# Patient Record
Sex: Female | Born: 1935 | Race: White | Hispanic: No | Marital: Married | Smoking: Never smoker
Health system: Southern US, Community
[De-identification: ages and names within clinical notes are randomized; demographics above are authoritative.]

## PROBLEM LIST (undated history)

## (undated) DIAGNOSIS — E559 Vitamin D deficiency, unspecified: Secondary | ICD-10-CM

## (undated) DIAGNOSIS — N289 Disorder of kidney and ureter, unspecified: Secondary | ICD-10-CM

## (undated) DIAGNOSIS — E785 Hyperlipidemia, unspecified: Secondary | ICD-10-CM

## (undated) DIAGNOSIS — I10 Essential (primary) hypertension: Secondary | ICD-10-CM

## (undated) DIAGNOSIS — K219 Gastro-esophageal reflux disease without esophagitis: Secondary | ICD-10-CM

## (undated) DIAGNOSIS — M810 Age-related osteoporosis without current pathological fracture: Secondary | ICD-10-CM

## (undated) DIAGNOSIS — H269 Unspecified cataract: Secondary | ICD-10-CM

## (undated) HISTORY — PX: APPENDECTOMY: SHX54

---

## 1999-07-14 ENCOUNTER — Encounter: Payer: Self-pay | Admitting: Family Medicine

## 1999-07-14 ENCOUNTER — Ambulatory Visit (HOSPITAL_COMMUNITY): Admission: RE | Admit: 1999-07-14 | Discharge: 1999-07-14 | Payer: Self-pay | Admitting: Family Medicine

## 2006-05-17 ENCOUNTER — Ambulatory Visit: Payer: Self-pay | Admitting: Emergency Medicine

## 2006-06-28 ENCOUNTER — Encounter: Admission: RE | Admit: 2006-06-28 | Discharge: 2006-07-26 | Payer: Self-pay | Admitting: Surgery

## 2008-10-22 ENCOUNTER — Encounter: Admission: RE | Admit: 2008-10-22 | Discharge: 2008-10-22 | Payer: Self-pay | Admitting: Family Medicine

## 2009-10-26 ENCOUNTER — Encounter: Admission: RE | Admit: 2009-10-26 | Discharge: 2009-10-26 | Payer: Self-pay | Admitting: Family Medicine

## 2010-12-12 ENCOUNTER — Encounter: Payer: Self-pay | Admitting: Family Medicine

## 2010-12-22 ENCOUNTER — Encounter: Payer: Self-pay | Admitting: Family Medicine

## 2011-09-19 ENCOUNTER — Other Ambulatory Visit: Payer: Self-pay | Admitting: Family Medicine

## 2011-09-20 ENCOUNTER — Other Ambulatory Visit: Payer: Self-pay | Admitting: Family Medicine

## 2011-09-20 DIAGNOSIS — Z1231 Encounter for screening mammogram for malignant neoplasm of breast: Secondary | ICD-10-CM

## 2011-09-20 DIAGNOSIS — M8580 Other specified disorders of bone density and structure, unspecified site: Secondary | ICD-10-CM

## 2011-10-31 ENCOUNTER — Ambulatory Visit
Admission: RE | Admit: 2011-10-31 | Discharge: 2011-10-31 | Disposition: A | Discharge status: Home / Self Care | Payer: Medicare Other | Source: Ambulatory Visit | Attending: Family Medicine | Admitting: Family Medicine

## 2011-10-31 DIAGNOSIS — Z1231 Encounter for screening mammogram for malignant neoplasm of breast: Secondary | ICD-10-CM

## 2011-10-31 DIAGNOSIS — M8580 Other specified disorders of bone density and structure, unspecified site: Secondary | ICD-10-CM

## 2012-10-15 ENCOUNTER — Other Ambulatory Visit: Payer: Self-pay | Admitting: Family Medicine

## 2012-10-15 DIAGNOSIS — Z1231 Encounter for screening mammogram for malignant neoplasm of breast: Secondary | ICD-10-CM

## 2012-10-15 DIAGNOSIS — M8580 Other specified disorders of bone density and structure, unspecified site: Secondary | ICD-10-CM

## 2013-10-30 ENCOUNTER — Other Ambulatory Visit: Payer: Self-pay | Admitting: Family Medicine

## 2013-10-30 ENCOUNTER — Other Ambulatory Visit: Payer: Self-pay

## 2013-10-30 DIAGNOSIS — Z1231 Encounter for screening mammogram for malignant neoplasm of breast: Secondary | ICD-10-CM

## 2013-10-30 DIAGNOSIS — M8580 Other specified disorders of bone density and structure, unspecified site: Secondary | ICD-10-CM

## 2013-10-31 ENCOUNTER — Ambulatory Visit
Admission: RE | Admit: 2013-10-31 | Discharge: 2013-10-31 | Disposition: A | Discharge status: Home / Self Care | Payer: Medicare Other | Source: Ambulatory Visit

## 2013-10-31 ENCOUNTER — Other Ambulatory Visit: Payer: Medicare Other

## 2013-10-31 ENCOUNTER — Ambulatory Visit: Payer: Medicare Other

## 2013-10-31 ENCOUNTER — Ambulatory Visit
Admission: RE | Admit: 2013-10-31 | Discharge: 2013-10-31 | Disposition: A | Discharge status: Home / Self Care | Payer: Medicare Other | Source: Ambulatory Visit | Attending: Family Medicine | Admitting: Family Medicine

## 2013-10-31 DIAGNOSIS — M8580 Other specified disorders of bone density and structure, unspecified site: Secondary | ICD-10-CM

## 2013-10-31 DIAGNOSIS — Z1231 Encounter for screening mammogram for malignant neoplasm of breast: Secondary | ICD-10-CM

## 2014-09-19 ENCOUNTER — Encounter (HOSPITAL_COMMUNITY): Payer: Self-pay | Admitting: Emergency Medicine

## 2014-09-19 ENCOUNTER — Emergency Department (HOSPITAL_COMMUNITY)
Admission: EM | Admit: 2014-09-19 | Discharge: 2014-09-19 | Disposition: A | Discharge status: Home / Self Care | Payer: Medicare Other | Attending: Emergency Medicine | Admitting: Emergency Medicine

## 2014-09-19 ENCOUNTER — Emergency Department (HOSPITAL_COMMUNITY): Payer: Medicare Other

## 2014-09-19 DIAGNOSIS — Z79899 Other long term (current) drug therapy: Secondary | ICD-10-CM | POA: Insufficient documentation

## 2014-09-19 DIAGNOSIS — Z880 Allergy status to penicillin: Secondary | ICD-10-CM | POA: Insufficient documentation

## 2014-09-19 DIAGNOSIS — Z8742 Personal history of other diseases of the female genital tract: Secondary | ICD-10-CM | POA: Insufficient documentation

## 2014-09-19 DIAGNOSIS — R1031 Right lower quadrant pain: Secondary | ICD-10-CM | POA: Diagnosis not present

## 2014-09-19 DIAGNOSIS — K529 Noninfective gastroenteritis and colitis, unspecified: Secondary | ICD-10-CM | POA: Diagnosis not present

## 2014-09-19 DIAGNOSIS — Z7982 Long term (current) use of aspirin: Secondary | ICD-10-CM | POA: Diagnosis not present

## 2014-09-19 DIAGNOSIS — Z792 Long term (current) use of antibiotics: Secondary | ICD-10-CM | POA: Diagnosis not present

## 2014-09-19 DIAGNOSIS — I10 Essential (primary) hypertension: Secondary | ICD-10-CM | POA: Diagnosis not present

## 2014-09-19 DIAGNOSIS — K219 Gastro-esophageal reflux disease without esophagitis: Secondary | ICD-10-CM | POA: Insufficient documentation

## 2014-09-19 DIAGNOSIS — E785 Hyperlipidemia, unspecified: Secondary | ICD-10-CM | POA: Insufficient documentation

## 2014-09-19 DIAGNOSIS — Z9089 Acquired absence of other organs: Secondary | ICD-10-CM | POA: Diagnosis not present

## 2014-09-19 HISTORY — DX: Disorder of kidney and ureter, unspecified: N28.9

## 2014-09-19 HISTORY — DX: Age-related osteoporosis without current pathological fracture: M81.0

## 2014-09-19 HISTORY — DX: Unspecified cataract: H26.9

## 2014-09-19 HISTORY — DX: Hyperlipidemia, unspecified: E78.5

## 2014-09-19 HISTORY — DX: Essential (primary) hypertension: I10

## 2014-09-19 HISTORY — DX: Gastro-esophageal reflux disease without esophagitis: K21.9

## 2014-09-19 HISTORY — DX: Vitamin D deficiency, unspecified: E55.9

## 2014-09-19 LAB — COMPREHENSIVE METABOLIC PANEL
ALT: 28 U/L (ref 0–35)
AST: 22 U/L (ref 0–37)
Albumin: 3.5 g/dL (ref 3.5–5.2)
Alkaline Phosphatase: 52 U/L (ref 39–117)
Anion gap: 14 (ref 5–15)
BUN: 37 mg/dL — ABNORMAL HIGH (ref 6–23)
CO2: 24 mEq/L (ref 19–32)
Calcium: 9.2 mg/dL (ref 8.4–10.5)
Chloride: 96 mEq/L (ref 96–112)
Creatinine, Ser: 1.67 mg/dL — ABNORMAL HIGH (ref 0.50–1.10)
GFR calc Af Amer: 33 mL/min — ABNORMAL LOW (ref >90)
GFR calc non Af Amer: 28 mL/min — ABNORMAL LOW (ref >90)
Glucose, Bld: 109 mg/dL — ABNORMAL HIGH (ref 70–99)
Potassium: 4.5 mEq/L (ref 3.7–5.3)
Sodium: 134 mEq/L — ABNORMAL LOW (ref 137–147)
Total Bilirubin: 0.4 mg/dL (ref 0.3–1.2)
Total Protein: 6.8 g/dL (ref 6.0–8.3)

## 2014-09-19 LAB — URINE MICROSCOPIC-ADD ON

## 2014-09-19 LAB — CBC WITH DIFFERENTIAL/PLATELET
Basophils Absolute: 0 10*3/uL (ref 0.0–0.1)
Basophils Relative: 0 % (ref 0–1)
Eosinophils Absolute: 0.1 10*3/uL (ref 0.0–0.7)
Eosinophils Relative: 1 % (ref 0–5)
HCT: 38.1 % (ref 36.0–46.0)
Hemoglobin: 12.9 g/dL (ref 12.0–15.0)
Lymphocytes Relative: 11 % — ABNORMAL LOW (ref 12–46)
Lymphs Abs: 3.2 10*3/uL (ref 0.7–4.0)
MCH: 32.2 pg (ref 26.0–34.0)
MCHC: 33.9 g/dL (ref 30.0–36.0)
MCV: 95 fL (ref 78.0–100.0)
Monocytes Absolute: 2.9 10*3/uL — ABNORMAL HIGH (ref 0.1–1.0)
Monocytes Relative: 10 % (ref 3–12)
Neutro Abs: 22 10*3/uL — ABNORMAL HIGH (ref 1.7–7.7)
Neutrophils Relative %: 78 % — ABNORMAL HIGH (ref 43–77)
Platelets: 209 10*3/uL (ref 150–400)
RBC: 4.01 MIL/uL (ref 3.87–5.11)
RDW: 12.4 % (ref 11.5–15.5)
WBC: 28.3 10*3/uL — ABNORMAL HIGH (ref 4.0–10.5)

## 2014-09-19 LAB — URINALYSIS, ROUTINE W REFLEX MICROSCOPIC
Bilirubin Urine: NEGATIVE
Glucose, UA: NEGATIVE mg/dL
Hgb urine dipstick: NEGATIVE
Ketones, ur: NEGATIVE mg/dL
Nitrite: NEGATIVE
Protein, ur: NEGATIVE mg/dL
Specific Gravity, Urine: 1.018 (ref 1.005–1.030)
Urobilinogen, UA: 0.2 mg/dL (ref 0.0–1.0)
pH: 5 (ref 5.0–8.0)

## 2014-09-19 LAB — LIPASE, BLOOD: Lipase: 31 U/L (ref 11–59)

## 2014-09-19 MED ORDER — HYDROCODONE-ACETAMINOPHEN 5-325 MG PO TABS
2.0000 | ORAL_TABLET | Freq: Once | ORAL | Status: AC
  Administered 2014-09-19: 2 via ORAL
  Filled 2014-09-19: qty 2

## 2014-09-19 MED ORDER — CIPROFLOXACIN HCL 500 MG PO TABS: 500.0000 mg | ORAL_TABLET | Freq: Two times a day (BID) | ORAL | Status: DC

## 2014-09-19 MED ORDER — HYDROCODONE-ACETAMINOPHEN 5-325 MG PO TABS: 1.0000 | ORAL_TABLET | Freq: Four times a day (QID) | ORAL | Status: DC | PRN

## 2014-09-19 MED ORDER — ONDANSETRON HCL 4 MG PO TABS: 4.0000 mg | ORAL_TABLET | Freq: Four times a day (QID) | ORAL | Status: DC

## 2014-09-19 MED ORDER — METRONIDAZOLE 500 MG PO TABS: 500.0000 mg | ORAL_TABLET | Freq: Two times a day (BID) | ORAL | Status: DC

## 2014-09-19 MED ORDER — METRONIDAZOLE 500 MG PO TABS
500.0000 mg | ORAL_TABLET | Freq: Once | ORAL | Status: AC
  Administered 2014-09-19: 500 mg via ORAL
  Filled 2014-09-19: qty 1

## 2014-09-19 MED ORDER — CIPROFLOXACIN HCL 500 MG PO TABS
500.0000 mg | ORAL_TABLET | Freq: Once | ORAL | Status: AC
  Administered 2014-09-19: 500 mg via ORAL
  Filled 2014-09-19: qty 1

## 2014-09-19 NOTE — Discharge Instructions (Signed)

## 2014-09-19 NOTE — ED Provider Notes (Addendum)
CSN: [id number]     Arrival date & time 09/19/14  1731 History   First MD Initiated Contact with Patient 09/19/14 1859     Chief Complaint  Patient presents with  . Abdominal Pain     (Consider location/radiation/quality/duration/timing/severity/associated sxs/prior Treatment) Patient is a 80 y.o. female presenting with abdominal pain. The history is provided by the patient and the spouse. No language interpreter was used.  Abdominal Pain Pain location:  RLQ Pain quality: sharp   Pain radiates to:  Does not radiate Pain severity:  Severe Onset quality:  Gradual Duration:  1 day Timing:  Constant Progression:  Unchanged Chronicity:  New Relieved by:  Nothing Worsened by:  Nothing tried Ineffective treatments:  None tried Associated symptoms: no anorexia, no chest pain, no chills, no constipation, no cough, no diarrhea, no dysuria, no fatigue, no fever, no hematuria, no melena, no nausea, no shortness of breath, no sore throat and no vomiting   Risk factors: being elderly   Risk factors comment:  History of appendectomy   Past Medical History  Diagnosis Date  . Hypertension   . Renal disorder   . GERD (gastroesophageal reflux disease)   . Hyperlipemia   . Cataract   . Osteoporosis   . Vitamin D deficiency disease    Past Surgical History  Procedure Laterality Date  . Appendectomy     No family history on file. History  Substance Use Topics  . Smoking status: Never Smoker   . Smokeless tobacco: Not on file  . Alcohol Use: No   OB History   Grav Para Term Preterm Abortions TAB SAB Ect Mult Living                 Review of Systems  Constitutional: Negative for fever, chills, diaphoresis, activity change, appetite change and fatigue.  HENT: Negative for congestion, facial swelling, rhinorrhea and sore throat.   Eyes: Negative for photophobia and discharge.  Respiratory: Negative for cough, chest tightness and shortness of breath.   Cardiovascular: Negative for  chest pain, palpitations and leg swelling.  Gastrointestinal: Positive for abdominal pain. Negative for nausea, vomiting, diarrhea, constipation, melena and anorexia.  Endocrine: Negative for polydipsia and polyuria.  Genitourinary: Negative for dysuria, frequency, hematuria, difficulty urinating and pelvic pain.  Musculoskeletal: Negative for arthralgias, back pain, neck pain and neck stiffness.  Skin: Negative for color change and wound.  Allergic/Immunologic: Negative for immunocompromised state.  Neurological: Negative for facial asymmetry, weakness, numbness and headaches.  Hematological: Does not bruise/bleed easily.  Psychiatric/Behavioral: Negative for confusion and agitation.      Allergies  Cortisone and Penicillins  Home Medications   Prior to Admission medications   Medication Sig Start Date End Date Taking? Authorizing Provider  ALPRAZolam (XANAX) 0.25 MG tablet Take 0.25 mg by mouth 2 (two) times daily as needed for anxiety (anxiety).   Yes Historical Provider, MD  aspirin 81 MG tablet Take 81 mg by mouth daily.   Yes Historical Provider, MD  bisoprolol-hydrochlorothiazide (ZIAC) 10-6.25 MG per tablet Take 1 tablet by mouth 2 (two) times daily.   Yes Historical Provider, MD  losartan (COZAAR) 100 MG tablet Take 100 mg by mouth daily.   Yes Historical Provider, MD  pantoprazole (PROTONIX) 40 MG tablet Take 40 mg by mouth daily.   Yes Historical Provider, MD  pseudoephedrine-acetaminophen (TYLENOL SINUS) 30-500 MG TABS Take 1 tablet by mouth every 4 (four) hours as needed (headache).   Yes Historical Provider, MD  simvastatin (ZOCOR) 20 MG tablet  Take 20 mg by mouth daily.   Yes Historical Provider, MD  ciprofloxacin (CIPRO) 500 MG tablet Take 1 tablet (500 mg total) by mouth 2 (two) times daily. One po bid x 7 days 09/19/14   Toy CookeyMegan Eyad Rochford, MD  HYDROcodone-acetaminophen (NORCO) 5-325 MG per tablet Take 1-2 tablets by mouth every 6 (six) hours as needed for moderate pain.  09/19/14   Toy CookeyMegan Unnamed Hino, MD  metroNIDAZOLE (FLAGYL) 500 MG tablet Take 1 tablet (500 mg total) by mouth 2 (two) times daily. One po bid x 7 days 09/19/14   Toy CookeyMegan Janie Capp, MD  ondansetron (ZOFRAN) 4 MG tablet Take 1 tablet (4 mg total) by mouth every 6 (six) hours. 09/19/14   Toy CookeyMegan Linda Biehn, MD   BP 128/69  Pulse 66  Temp(Src) 97.6 F (36.4 C) (Oral)  Resp 16  SpO2 99% Physical Exam  Constitutional: She is oriented to person, place, and time. She appears well-developed and well-nourished. No distress.  HENT:  Head: Normocephalic and atraumatic.  Mouth/Throat: No oropharyngeal exudate.  Eyes: Pupils are equal, round, and reactive to light.  Neck: Normal range of motion. Neck supple.  Cardiovascular: Normal rate, regular rhythm and normal heart sounds.  Exam reveals no gallop and no friction rub.   No murmur heard. Pulmonary/Chest: Effort normal and breath sounds normal. No respiratory distress. She has no wheezes. She has no rales.  Abdominal: Soft. Bowel sounds are normal. She exhibits no distension and no mass. There is no tenderness. There is no rigidity, no rebound and no guarding.  Musculoskeletal: Normal range of motion. She exhibits no edema or tenderness.  Neurological: She is alert and oriented to person, place, and time.  Skin: Skin is warm and dry.  Psychiatric: She has a normal mood and affect.    ED Course  Procedures (including critical care time) Labs Review Labs Reviewed  CBC WITH DIFFERENTIAL - Abnormal; Notable for the following:    WBC 28.3 (*)    Neutrophils Relative % 78 (*)    Neutro Abs 22.0 (*)    Lymphocytes Relative 11 (*)    Monocytes Absolute 2.9 (*)    All other components within normal limits  COMPREHENSIVE METABOLIC PANEL - Abnormal; Notable for the following:    Sodium 134 (*)    Glucose, Bld 109 (*)    BUN 37 (*)    Creatinine, Ser 1.67 (*)    GFR calc non Af Amer 28 (*)    GFR calc Af Amer 33 (*)    All other components within normal  limits  URINALYSIS, ROUTINE W REFLEX MICROSCOPIC - Abnormal; Notable for the following:    APPearance CLOUDY (*)    Leukocytes, UA MODERATE (*)    All other components within normal limits  URINE MICROSCOPIC-ADD ON - Abnormal; Notable for the following:    Squamous Epithelial / LPF FEW (*)    All other components within normal limits  URINE CULTURE  LIPASE, BLOOD    Imaging Review Ct Abdomen Pelvis Wo Contrast  09/19/2014   CLINICAL DATA:  Right lower quadrant pain for 2 days  EXAM: CT ABDOMEN AND PELVIS WITHOUT CONTRAST  TECHNIQUE: Multidetector CT imaging of the abdomen and pelvis was performed following the standard protocol without IV contrast.  COMPARISON:  None.  FINDINGS: The lung bases are free of acute infiltrate or sizable effusion.  The liver, gallbladder, spleen, adrenal glands and pancreas are all normal in their CT appearance. The kidneys are well visualized bilaterally and reveal no renal calculi or  obstructive changes. Aortic calcifications are seen without aneurysmal dilatation. The appendix is not visualized consistent with the patient's given clinical history of prior appendectomy. The bladder is well distended. The uterus has been surgically removed. Diverticular change of the sigmoid colon is noted without evidence of diverticulitis. No perforation is seen. Some very mild inflammatory changes noted surrounding the cecum which is filled with fecal material. This is of uncertain significance but may represent some very early colitis. Degenerative changes of the lumbar spine are seen.  IMPRESSION: Mild right lower quadrant inflammatory changes surrounding the cecum. This may represent some very early colitis.  Diverticulosis without diverticulitis.  No other focal abnormality is seen.   Electronically Signed   By: Alcide CleverMark  Lukens M.D.   On: 09/19/2014 21:08     EKG Interpretation None      MDM   Final diagnoses:  RLQ abdominal pain  Colitis    Pt is a 80 y.o. female with  Pmhx as above who presents with constant sharp right lower quadrant pain since yesterday. Pain is unchanged by eating or drinking. She's had no associated fever, chills, anorexia, urinary symptoms, diarrhea or constipation. On physical exam vital signs are stable and patient is in no acute distress. She has R surgical scar in the right lower quadrant from prior appendectomy. She is tenderness over palpation of this area with palpable mass that is likely hernia. She's no overlying skin changes.  Labs with leukocytosis, the patient has recently received cortisone injection. Creatinine is mildly elevated at 1.67 without prior for comparison. LFTs normal. Lipase normal. CT abdomen pelvis with mild right lower quadrant inflammatory changes around the cecum may represent very early colitis which I believe is consistent with history and physical exam. We'll give first dose of by mouth Cipro Flagyl and by mouth Norco in the ED. We'll plan to discharge home with same assuming she tolerates these. I will also have her follow-up closely with her PCP. Return precautions given for new or worsening symptoms including worsening pain, fever, inability to tolerate liquids.    Toy CookeyMegan Cheyenna Pankowski, MD 09/20/14 1200  Toy CookeyMegan Nicolus Ose, MD 10/06/14 [id number]

## 2014-09-19 NOTE — ED Notes (Signed)
Pt ambulated with assistance to rest room, states she was to nervous to provide urine specimen.

## 2014-09-19 NOTE — ED Notes (Signed)
Pt still states that she is unable to urinate, going to CT now.

## 2014-09-19 NOTE — ED Notes (Signed)
Pt c/o RLQ pain that started yesterday.  [facility] and was sent here for further evaluation bc doctor there is concerned that pt could have bowel obstruction, diverticulitis since pt had appendectomy when she was 80 years old. Pt denies n/v/d.

## 2014-09-22 LAB — URINE CULTURE: Colony Count: >=100000

## 2014-09-23 ENCOUNTER — Telehealth (HOSPITAL_BASED_OUTPATIENT_CLINIC_OR_DEPARTMENT_OTHER): Payer: Self-pay | Admitting: Emergency Medicine

## 2014-09-23 NOTE — Telephone Encounter (Signed)
Post ED Visit - Positive Culture Follow-up  Culture report reviewed by antimicrobial stewardship pharmacist: [x]  Wes Dulaney, Pharm.D., BCPS []  Celedonio MiyamotoJeremy Frens, Pharm.D., BCPS []  Georgina PillionElizabeth Martin, Pharm.D., BCPS []  VeguitaMinh Pham, [address]D., BCPS, AAHIVP []  Estella HuskMichelle Turner, Pharm.D., BCPS, AAHIVP []  Carly Sabat, Pharm.D. []  Enzo BiNathan Batchelder, [address]  Positive urine culture >100,000 colonies/ml E. Coli Treated with ciprofloxacin and metronidazole , organism sensitive to the same and no further patient follow-up is required at this time.  Berle MullMiller, Hagop Mccollam 09/23/2014, 12:11 PM

## 2015-11-09 ENCOUNTER — Other Ambulatory Visit: Payer: Self-pay | Admitting: Family Medicine

## 2015-11-09 DIAGNOSIS — M8580 Other specified disorders of bone density and structure, unspecified site: Secondary | ICD-10-CM

## 2015-12-11 ENCOUNTER — Inpatient Hospital Stay: Admission: RE | Admit: 2015-12-11 | Payer: Self-pay | Source: Ambulatory Visit

## 2016-02-02 ENCOUNTER — Ambulatory Visit
Admission: RE | Admit: 2016-02-02 | Discharge: 2016-02-02 | Disposition: A | Discharge status: Home / Self Care | Payer: Medicare Other | Source: Ambulatory Visit | Attending: Family Medicine | Admitting: Family Medicine

## 2016-02-02 DIAGNOSIS — M8580 Other specified disorders of bone density and structure, unspecified site: Secondary | ICD-10-CM

## 2016-07-20 ENCOUNTER — Other Ambulatory Visit: Payer: Self-pay | Admitting: Physician Assistant

## 2016-07-20 DIAGNOSIS — R1310 Dysphagia, unspecified: Secondary | ICD-10-CM

## 2016-07-20 DIAGNOSIS — R1319 Other dysphagia: Secondary | ICD-10-CM

## 2016-07-27 ENCOUNTER — Ambulatory Visit
Admission: RE | Admit: 2016-07-27 | Discharge: 2016-07-27 | Disposition: A | Discharge status: Home / Self Care | Payer: Medicare Other | Source: Ambulatory Visit | Attending: Physician Assistant | Admitting: Physician Assistant

## 2016-07-27 DIAGNOSIS — R1310 Dysphagia, unspecified: Secondary | ICD-10-CM

## 2016-07-27 DIAGNOSIS — R1319 Other dysphagia: Secondary | ICD-10-CM

## 2017-02-27 ENCOUNTER — Other Ambulatory Visit: Payer: Self-pay | Admitting: Family Medicine

## 2017-02-27 DIAGNOSIS — Z1231 Encounter for screening mammogram for malignant neoplasm of breast: Secondary | ICD-10-CM

## 2017-03-23 ENCOUNTER — Ambulatory Visit
Admission: RE | Admit: 2017-03-23 | Discharge: 2017-03-23 | Disposition: A | Discharge status: Home / Self Care | Payer: Medicare Other | Source: Ambulatory Visit | Attending: Family Medicine | Admitting: Family Medicine

## 2017-03-23 DIAGNOSIS — Z1231 Encounter for screening mammogram for malignant neoplasm of breast: Secondary | ICD-10-CM

## 2018-04-30 ENCOUNTER — Other Ambulatory Visit: Payer: Self-pay | Admitting: Family Medicine

## 2018-04-30 DIAGNOSIS — Z1231 Encounter for screening mammogram for malignant neoplasm of breast: Secondary | ICD-10-CM

## 2018-06-05 ENCOUNTER — Ambulatory Visit
Admission: RE | Admit: 2018-06-05 | Discharge: 2018-06-05 | Disposition: A | Discharge status: Home / Self Care | Payer: Medicare Other | Source: Ambulatory Visit | Attending: Family Medicine | Admitting: Family Medicine

## 2018-06-05 DIAGNOSIS — Z1231 Encounter for screening mammogram for malignant neoplasm of breast: Secondary | ICD-10-CM

## 2018-06-06 ENCOUNTER — Ambulatory Visit: Payer: Medicare Other

## 2019-04-26 ENCOUNTER — Other Ambulatory Visit: Payer: Self-pay | Admitting: Family Medicine

## 2019-04-26 DIAGNOSIS — Z1231 Encounter for screening mammogram for malignant neoplasm of breast: Secondary | ICD-10-CM

## 2019-06-12 ENCOUNTER — Ambulatory Visit: Payer: Medicare Other

## 2019-07-15 ENCOUNTER — Ambulatory Visit: Payer: Medicare Other

## 2019-12-19 ENCOUNTER — Ambulatory Visit: Payer: Medicare Other

## 2019-12-27 ENCOUNTER — Ambulatory Visit: Payer: Medicare Other | Attending: Internal Medicine

## 2019-12-27 DIAGNOSIS — Z23 Encounter for immunization: Secondary | ICD-10-CM | POA: Insufficient documentation

## 2019-12-27 NOTE — Progress Notes (Signed)
   Covid-19 Vaccination Clinic  Name:  YULI LANIGAN    MRN: [id number] DOB: 1932-12-08  12/27/2019  Ms. Seeber was observed post Covid-19 immunization for 15 minutes without incidence. She was provided with Vaccine Information Sheet and instruction to access the V-Safe system.   Ms. Buchan was instructed to call 911 with any severe reactions post vaccine: Marland Kitchen Difficulty breathing  . Swelling of your face and throat  . A fast heartbeat  . A bad rash all over your body  . Dizziness and weakness    Immunizations Administered    Name Date Dose VIS Date Route   Pfizer COVID-19 Vaccine 12/27/2019 11:11 AM 0.3 mL 11/01/2019 Intramuscular   Manufacturer: [employer], [employer]   Lot: [id number]   NDC: [address]-2

## 2019-12-30 ENCOUNTER — Ambulatory Visit: Payer: Medicare Other

## 2020-01-21 ENCOUNTER — Ambulatory Visit: Payer: Medicare Other | Attending: Internal Medicine

## 2020-01-21 DIAGNOSIS — Z23 Encounter for immunization: Secondary | ICD-10-CM | POA: Insufficient documentation

## 2020-01-21 NOTE — Progress Notes (Signed)
   Covid-19 Vaccination Clinic  Name:  Elizabeth Brock    MRN: [id number] DOB: [birth year]  01/21/2020  Elizabeth Brock was observed post Covid-19 immunization for 15 minutes without incident. She was provided with Vaccine Information Sheet and instruction to access the V-Safe system.   Elizabeth Brock was instructed to call 911 with any severe reactions post vaccine: Marland Kitchen Difficulty breathing  . Swelling of face and throat  . A fast heartbeat  . A bad rash all over body  . Dizziness and weakness   Immunizations Administered    Name Date Dose VIS Date Route   Pfizer COVID-19 Vaccine 01/21/2020 11:35 AM 0.3 mL 11/01/2019 Intramuscular   Manufacturer: [employer], [employer]   Lot: [id number]   NDC: [address]-7

## 2020-04-10 ENCOUNTER — Other Ambulatory Visit: Payer: Self-pay | Admitting: Gastroenterology

## 2020-04-10 DIAGNOSIS — R1310 Dysphagia, unspecified: Secondary | ICD-10-CM

## 2020-04-14 ENCOUNTER — Other Ambulatory Visit: Payer: Self-pay | Admitting: Gastroenterology

## 2020-04-14 ENCOUNTER — Ambulatory Visit
Admission: RE | Admit: 2020-04-14 | Discharge: 2020-04-14 | Disposition: A | Discharge status: Home / Self Care | Payer: Medicare Other | Source: Ambulatory Visit | Attending: Gastroenterology | Admitting: Gastroenterology

## 2020-04-14 DIAGNOSIS — R1310 Dysphagia, unspecified: Secondary | ICD-10-CM

## 2020-07-30 ENCOUNTER — Other Ambulatory Visit: Payer: Self-pay | Admitting: Podiatry

## 2020-07-30 ENCOUNTER — Ambulatory Visit (INDEPENDENT_AMBULATORY_CARE_PROVIDER_SITE_OTHER): Payer: Medicare Other | Admitting: Podiatry

## 2020-07-30 ENCOUNTER — Encounter: Payer: Self-pay | Admitting: Podiatry

## 2020-07-30 ENCOUNTER — Other Ambulatory Visit: Payer: Self-pay

## 2020-07-30 ENCOUNTER — Ambulatory Visit (INDEPENDENT_AMBULATORY_CARE_PROVIDER_SITE_OTHER): Payer: Medicare Other

## 2020-07-30 DIAGNOSIS — M778 Other enthesopathies, not elsewhere classified: Secondary | ICD-10-CM

## 2020-07-30 DIAGNOSIS — B351 Tinea unguium: Secondary | ICD-10-CM | POA: Diagnosis not present

## 2020-07-30 DIAGNOSIS — M722 Plantar fascial fibromatosis: Secondary | ICD-10-CM

## 2020-07-30 DIAGNOSIS — M79676 Pain in unspecified toe(s): Secondary | ICD-10-CM | POA: Diagnosis not present

## 2020-07-30 DIAGNOSIS — M19079 Primary osteoarthritis, unspecified ankle and foot: Secondary | ICD-10-CM

## 2020-07-30 NOTE — Progress Notes (Signed)
  Subjective:  Patient ID: Elizabeth Brock, female    DOB: 07-13-1933,  MRN: [id number] HPI Chief Complaint  Patient presents with  . Foot Pain    Dorsal midfoot left - aching x years intermittent, swelling, tried aspirin  . New Patient (Initial Visit)    86 y.o. female presents with the above complaint.   ROS: Denies fever chills nausea vomiting muscle aches pains calf pain back pain chest pain shortness of breath.  Past Medical History:  Diagnosis Date  . Cataract   . GERD (gastroesophageal reflux disease)   . Hyperlipemia   . Hypertension   . Osteoporosis   . Renal disorder   . Vitamin D deficiency disease    Past Surgical History:  Procedure Laterality Date  . APPENDECTOMY      Current Outpatient Medications:  .  alendronate (FOSAMAX) 70 MG tablet, Take 70 mg by mouth once a week., Disp: , Rfl:  .  ALPRAZolam (XANAX) 0.25 MG tablet, Take 0.25 mg by mouth 2 (two) times daily as needed for anxiety (anxiety)., Disp: , Rfl:  .  aspirin 81 MG tablet, Take 81 mg by mouth daily., Disp: , Rfl:  .  bisoprolol-hydrochlorothiazide (ZIAC) 10-6.25 MG per tablet, Take 1 tablet by mouth 2 (two) times daily., Disp: , Rfl:  .  famotidine (PEPCID) 20 MG tablet, Take 20 mg by mouth 2 (two) times daily., Disp: , Rfl:  .  losartan (COZAAR) 100 MG tablet, Take 100 mg by mouth daily., Disp: , Rfl:  .  sertraline (ZOLOFT) 50 MG tablet, Take 75 mg by mouth daily., Disp: , Rfl:  .  simvastatin (ZOCOR) 20 MG tablet, Take 20 mg by mouth daily., Disp: , Rfl:   Allergies  Allergen Reactions  . Cortisone Hives    Hives around neck.  . Penicillins Hives    Hives around ears.   Review of Systems Objective:  There were no vitals filed for this visit.  General: Well developed, nourished, in no acute distress, alert and oriented x3   Dermatological: Skin is warm, dry and supple bilateral. Nails x 10 are thick yellow dystrophic possibly mycotic; remaining integument appears unremarkable at this  time. There are no open sores, no preulcerative lesions, no rash or signs of infection present.  Nails are thicker and dystrophic possibly mycotic.  Vascular: Dorsalis Pedis artery and Posterior Tibial artery pedal pulses are 2/4 bilateral with immedate capillary fill time. Pedal hair growth present. No varicosities and no lower extremity edema present bilateral.   Neruologic: Grossly intact via light touch bilateral. Vibratory intact via tuning fork bilateral. Protective threshold with Semmes Wienstein monofilament intact to all pedal sites bilateral. Patellar and Achilles deep tendon reflexes 2+ bilateral. No Babinski or clonus noted bilateral.   Musculoskeletal: No gross boney pedal deformities bilateral. No pain, crepitus, or limitation noted with foot and ankle range of motion bilateral. Muscular strength 5/5 in all groups tested bilateral.  She has pain on palpation dorsal aspect of the left foot.  No erythema cellulitis drainage or odor  Gait: Unassisted, Nonantalgic.    Radiographs:  Radiographs taken today demonstrate significant osteoarthritic changes of the midfoot left.  Assessment & Plan:   Assessment: Capsulitis osteoarthritis dorsal aspect midfoot left.  Pain in limb secondary to onychomycosis.  Plan: Injected the dorsal aspect of the left foot today with 20 mg Kenalog 5 mg Marcaine point maximal tenderness.  Also debrided nails 1 through 5 bilateral.     Maxima Skelton T. [place], [place]

## 2020-11-01 ENCOUNTER — Emergency Department (HOSPITAL_COMMUNITY): Payer: Medicare Other

## 2020-11-01 ENCOUNTER — Ambulatory Visit (HOSPITAL_COMMUNITY)
Admission: EM | Admit: 2020-11-01 | Discharge: 2020-11-01 | Disposition: A | Discharge status: Home / Self Care | Payer: Medicare Other

## 2020-11-01 ENCOUNTER — Other Ambulatory Visit: Payer: Self-pay

## 2020-11-01 ENCOUNTER — Emergency Department (HOSPITAL_COMMUNITY)
Admission: EM | Admit: 2020-11-01 | Discharge: 2020-11-01 | Disposition: A | Discharge status: Home / Self Care | Payer: Medicare Other | Attending: Emergency Medicine | Admitting: Emergency Medicine

## 2020-11-01 ENCOUNTER — Encounter (HOSPITAL_COMMUNITY): Payer: Self-pay | Admitting: Emergency Medicine

## 2020-11-01 DIAGNOSIS — Z7982 Long term (current) use of aspirin: Secondary | ICD-10-CM | POA: Diagnosis not present

## 2020-11-01 DIAGNOSIS — M545 Low back pain, unspecified: Secondary | ICD-10-CM | POA: Insufficient documentation

## 2020-11-01 DIAGNOSIS — S0003XA Contusion of scalp, initial encounter: Secondary | ICD-10-CM | POA: Diagnosis not present

## 2020-11-01 DIAGNOSIS — S0000XA Unspecified superficial injury of scalp, initial encounter: Secondary | ICD-10-CM | POA: Diagnosis present

## 2020-11-01 DIAGNOSIS — W19XXXA Unspecified fall, initial encounter: Secondary | ICD-10-CM

## 2020-11-01 DIAGNOSIS — W06XXXA Fall from bed, initial encounter: Secondary | ICD-10-CM | POA: Diagnosis not present

## 2020-11-01 DIAGNOSIS — S39012A Strain of muscle, fascia and tendon of lower back, initial encounter: Secondary | ICD-10-CM

## 2020-11-01 DIAGNOSIS — S80212A Abrasion, left knee, initial encounter: Secondary | ICD-10-CM | POA: Insufficient documentation

## 2020-11-01 DIAGNOSIS — R0602 Shortness of breath: Secondary | ICD-10-CM | POA: Insufficient documentation

## 2020-11-01 DIAGNOSIS — Y92009 Unspecified place in unspecified non-institutional (private) residence as the place of occurrence of the external cause: Secondary | ICD-10-CM

## 2020-11-01 DIAGNOSIS — I10 Essential (primary) hypertension: Secondary | ICD-10-CM | POA: Insufficient documentation

## 2020-11-01 DIAGNOSIS — S80211A Abrasion, right knee, initial encounter: Secondary | ICD-10-CM | POA: Diagnosis not present

## 2020-11-01 MED ORDER — SODIUM CHLORIDE 0.9 % IV BOLUS: 1000.0000 mL | Freq: Once | INTRAVENOUS | Status: DC

## 2020-11-01 MED ORDER — HYDROCODONE-ACETAMINOPHEN 5-325 MG PO TABS
1.0000 | ORAL_TABLET | Freq: Once | ORAL | Status: AC
  Administered 2020-11-01: 1 via ORAL
  Filled 2020-11-01: qty 1

## 2020-11-01 MED ORDER — TRAMADOL HCL 50 MG PO TABS: 50.0000 mg | ORAL_TABLET | Freq: Four times a day (QID) | ORAL | 0 refills | Status: DC | PRN

## 2020-11-01 NOTE — ED Notes (Signed)
Spoke to patient and adult daughter.  Patient fell last night, tangled in bed sheets and fell on floor.  Patient remained on floor all night.  Patient was not able to get up.  Patient has particular pain in tailbone and did hit head.  Patient reports "everything" hurts.  Patient is not on blood thinners.  Spoke to providers.  Casimiro Needle, NP patient to go to ED for further evaluation

## 2020-11-01 NOTE — ED Notes (Signed)
Patient verbalized understanding of discharge instructions. Opportunity for questions and answers.  

## 2020-11-01 NOTE — Discharge Instructions (Signed)
Your xrays and CTs showed no fracture or bleeding   Take tylenol for pain   Take tramadol for severe pain   See your doctor   Return to ER if you have worse back pain, trouble walking, weakness, numbness

## 2020-11-01 NOTE — ED Provider Notes (Signed)
[facility] EMERGENCY DEPARTMENT Provider Note   CSN: [id number] Arrival date & time: 11/01/20  1601     History Chief Complaint  Patient presents with  . Fall    Elizabeth Brock is a 86 y.o. female hx of HL, HTN, here with sob.  Patient states that she got tangled in her feet and fell out of bed yesterday.  She states that she landed on her bottom and was laying on the floor.  She states that she was able to walk afterwards. She went to urgent care and was sent to the ED. She denies being on blood thinner   The history is provided by the patient.       Past Medical History:  Diagnosis Date  . Cataract   . GERD (gastroesophageal reflux disease)   . Hyperlipemia   . Hypertension   . Osteoporosis   . Renal disorder   . Vitamin D deficiency disease     There are no problems to display for this patient.   Past Surgical History:  Procedure Laterality Date  . APPENDECTOMY       OB History   No obstetric history on file.     Family History  Problem Relation Age of Onset  . Breast cancer Sister 33       Twin Sister    Social History   Tobacco Use  . Smoking status: Never Smoker  . Smokeless tobacco: Never Used  Substance Use Topics  . Alcohol use: No    Home Medications Prior to Admission medications   Medication Sig Start Date End Date Taking? Authorizing Provider  alendronate (FOSAMAX) 70 MG tablet Take 70 mg by mouth once a week. 06/11/20   [provider]  ALPRAZolam Prudy Feeler) 0.25 MG tablet Take 0.25 mg by mouth 2 (two) times daily as needed for anxiety (anxiety).    [provider]  aspirin 81 MG tablet Take 81 mg by mouth daily.    [provider]  bisoprolol-hydrochlorothiazide (ZIAC) 10-6.25 MG per tablet Take 1 tablet by mouth 2 (two) times daily.    [provider]  famotidine (PEPCID) 20 MG tablet Take 20 mg by mouth 2 (two) times daily. 06/02/20   [provider]  losartan (COZAAR)  100 MG tablet Take 100 mg by mouth daily.    [provider]  sertraline (ZOLOFT) 50 MG tablet Take 75 mg by mouth daily. 06/21/20   [provider]  simvastatin (ZOCOR) 20 MG tablet Take 20 mg by mouth daily.    [provider]    Allergies    Cortisone and Penicillins  Review of Systems   Review of Systems  Musculoskeletal: Positive for back pain.  All other systems reviewed and are negative.   Physical Exam Updated Vital Signs BP 133/88 (BP Location: Right Arm)   Pulse 71   Temp 98.4 F (36.9 C) (Oral)   Resp 18   SpO2 98%   Physical Exam Vitals and nursing note reviewed.  Constitutional:      Comments: Chronically ill and slightly uncomfortable   HENT:     Head: Normocephalic.     Comments: Mild posterior scalp hematoma     Mouth/Throat:     Mouth: Mucous membranes are moist.  Eyes:     Extraocular Movements: Extraocular movements intact.     Pupils: Pupils are equal, round, and reactive to light.  Cardiovascular:     Rate and Rhythm: Normal rate and regular rhythm.  Pulses: Normal pulses.     Heart sounds: Normal heart sounds.  Pulmonary:     Effort: Pulmonary effort is normal.     Breath sounds: Normal breath sounds.  Abdominal:     General: Abdomen is flat.     Palpations: Abdomen is soft.  Musculoskeletal:        General: Normal range of motion.     Cervical back: Normal range of motion and neck supple.     Comments: Mild lower lumbar tenderness, nl ROM bilateral hips, mild bilateral knee abrasions   Skin:    General: Skin is warm.     Capillary Refill: Capillary refill takes less than 2 seconds.  Neurological:     General: No focal deficit present.     Mental Status: She is oriented to person, place, and time.     Comments: CN 2- 12 intact, nl strength throughout, able to ambulate with assistance   Psychiatric:        Mood and Affect: Mood normal.        Behavior: Behavior normal.     ED Results / Procedures /  Treatments   Labs (all labs ordered are listed, but only abnormal results are displayed) Labs Reviewed - No data to display  EKG None  Radiology DG Chest 1 View  Result Date: 11/01/2020 CLINICAL DATA:  Fall, back pain EXAM: CHEST  1 VIEW COMPARISON:  None. FINDINGS: Lungs are well expanded, symmetric, and clear. No pneumothorax or pleural effusion. Cardiac size within normal limits. Pulmonary vascularity is normal. Osseous structures are age-appropriate. No acute bone abnormality. IMPRESSION: No active disease. Electronically Signed   By: Helyn Numbers MD   On: 11/01/2020 22:53   DG Lumbar Spine Complete  Result Date: 11/01/2020 CLINICAL DATA:  Fall, back pain EXAM: LUMBAR SPINE - COMPLETE 4+ VIEW COMPARISON:  None. FINDINGS: Five view radiograph lumbar spine demonstrates moderate lumbar levoscoliosis, apex left at L3. Loss of normal lumbar lordosis with overall straightening noted. No acute fracture or listhesis of the lumbar spine. Vertebral body height has been preserved. There is diffuse intervertebral disc space narrowing and endplate remodeling with multilevel vacuum disc phenomena noted in keeping with changes of diffuse severe degenerative disc disease. Oblique views demonstrate no evidence of pars defect. The paraspinal soft tissues are unremarkable. IMPRESSION: No acute fracture or listhesis. Advanced multilevel degenerative disc disease Moderate lumbar levoscoliosis. Electronically Signed   By: Helyn Numbers MD   On: 11/01/2020 22:59   DG Pelvis 1-2 Views  Result Date: 11/01/2020 CLINICAL DATA:  Fall, pelvic pain EXAM: PELVIS - 1-2 VIEW COMPARISON:  None. FINDINGS: There is no evidence of pelvic fracture or diastasis. No pelvic bone lesions are seen. IMPRESSION: Negative. Electronically Signed   By: Helyn Numbers MD   On: 11/01/2020 22:59   CT Head Wo Contrast  Result Date: 11/01/2020 CLINICAL DATA:  86 year old post fall last night. Facial trauma. EXAM: CT HEAD WITHOUT  CONTRAST TECHNIQUE: Contiguous axial images were obtained from the base of the skull through the vertex without intravenous contrast. COMPARISON:  None. FINDINGS: Brain: Age related atrophy. Moderate to advanced periventricular and deep white matter chronic small vessel ischemia. Bilateral basal gangliar mineralization is typically senescent. No intracranial hemorrhage, mass effect, or midline shift. No hydrocephalus. The basilar cisterns are patent. No evidence of territorial infarct or acute ischemia. No extra-axial or intracranial fluid collection. Vascular: No hyperdense vessel. Skull: No skull fracture or focal lesion. Sinuses/Orbits: Opacification of right side of sphenoid sinus with cortical  thickening and sclerosis, indicating chronicity. There is mucosal thickening throughout the ethmoid air cells. Small mucous retention cyst right maxillary sinus. Mastoid air cells are clear. Bilateral cataract resection. Other: None. IMPRESSION: 1. No acute intracranial abnormality. No skull fracture. 2. Age related atrophy and chronic small vessel ischemia. 3. Chronic right sphenoid sinusitis. Electronically Signed   By: Narda Rutherford M.D.   On: 11/01/2020 23:12   CT Cervical Spine Wo Contrast  Result Date: 11/01/2020 CLINICAL DATA:  Neck trauma. Fall last night. EXAM: CT CERVICAL SPINE WITHOUT CONTRAST TECHNIQUE: Multidetector CT imaging of the cervical spine was performed without intravenous contrast. Multiplanar CT image reconstructions were also generated. COMPARISON:  None. FINDINGS: Alignment: Mild broad-based levo scoliotic curvature. Trace and these C6 on C7, likely degenerative. No traumatic subluxation. Skull base and vertebrae: No acute fracture. Vertebral body heights are maintained. The dens and skull base are intact. Small sclerotic density within C7 vertebral body is nonspecific, but likely a bone island. Soft tissues and spinal canal: No prevertebral fluid or swelling. No visible canal hematoma.  Disc levels: Mild disc space narrowing at C3-C4 and C5-C6. Moderate multilevel facet hypertrophy. There is mild degenerative changes C1-C2. Upper chest: No acute or unexpected findings. Other: Carotid calcifications. IMPRESSION: Degenerative change in the cervical spine without acute fracture or subluxation. Electronically Signed   By: Narda Rutherford M.D.   On: 11/01/2020 23:16   DG Knee Complete 4 Views Left  Result Date: 11/01/2020 CLINICAL DATA:  Fall, left knee pain EXAM: LEFT KNEE - COMPLETE 4+ VIEW COMPARISON:  None. FINDINGS: Four view radiograph left knee demonstrates normal alignment. No fracture or dislocation. Medial and lateral compartment joint spaces are preserved. No effusion. Vascular calcifications are seen within the posterior soft tissues. IMPRESSION: No acute fracture or dislocation. Electronically Signed   By: Helyn Numbers MD   On: 11/01/2020 22:54   DG Knee Complete 4 Views Right  Result Date: 11/01/2020 CLINICAL DATA:  Fall, right knee pain EXAM: RIGHT KNEE - COMPLETE 4+ VIEW COMPARISON:  None. FINDINGS: Four view radiograph right knee demonstrates normal alignment. No fracture or dislocation. Medial and lateral compartment joint spaces are preserved. No effusion. Mild degenerative enthesopathy involving the insertion of the quadriceps tendon upon the patella. Vascular calcifications are seen within the posterior soft tissues. IMPRESSION: No acute fracture or dislocation. Electronically Signed   By: Helyn Numbers MD   On: 11/01/2020 22:55    Procedures Procedures (including critical care time)  Medications Ordered in ED Medications  HYDROcodone-acetaminophen (NORCO/VICODIN) 5-325 MG per tablet 1 tablet (1 tablet Oral Given 11/01/20 2311)    ED Course  I have reviewed the triage vital signs and the nursing notes.  Pertinent labs & imaging results that were available during my care of the patient were reviewed by me and considered in my medical decision making (see  chart for details).    MDM Rules/Calculators/A&P                         Elizabeth Brock is a 86 y.o. female here with fall. Patient fell yesterday. Will get CT head/neck, xrays. Patient able to ambulate.    11:22 PM Xray and CTs unremarkable and showed no fractures or bleed. Stable for discharge home. Will give tramadol prn pain   Final Clinical Impression(s) / ED Diagnoses Final diagnoses:  None    Rx / DC Orders ED Discharge Orders    None       Silverio Lay,  Gonzella Lexavid Hsienta, MD 11/01/20 [id number]

## 2020-11-01 NOTE — ED Notes (Signed)
Patient is being discharged from the Urgent Care and sent to the Emergency Department via private vehicle.  . Per Dahlia Byes, NP, patient is in need of higher level of care due to age, injury. Patient is aware and verbalizes understanding of plan of care. There were no vitals filed for this visit.

## 2020-11-01 NOTE — ED Triage Notes (Addendum)
Pt fell out of bed last night and hit back of head.  Denies head and neck pain.  Reports pain to tailbone.  Ambulatory with walker.  No blood thinner.

## 2020-12-21 ENCOUNTER — Other Ambulatory Visit: Payer: Self-pay | Admitting: Family Medicine

## 2020-12-21 DIAGNOSIS — M810 Age-related osteoporosis without current pathological fracture: Secondary | ICD-10-CM

## 2020-12-22 ENCOUNTER — Ambulatory Visit: Payer: Medicare Other | Admitting: Podiatry

## 2021-05-17 ENCOUNTER — Other Ambulatory Visit: Payer: Self-pay

## 2021-05-17 ENCOUNTER — Ambulatory Visit
Admission: RE | Admit: 2021-05-17 | Discharge: 2021-05-17 | Disposition: A | Discharge status: Home / Self Care | Payer: Medicare Other | Source: Ambulatory Visit | Attending: Family Medicine | Admitting: Family Medicine

## 2021-05-17 DIAGNOSIS — M810 Age-related osteoporosis without current pathological fracture: Secondary | ICD-10-CM

## 2022-03-04 ENCOUNTER — Inpatient Hospital Stay (HOSPITAL_COMMUNITY)
Admission: EM | Admit: 2022-03-04 | Discharge: 2022-03-21 | DRG: 023 | Disposition: E | Payer: Medicare Other | Attending: Neurology | Admitting: Neurology

## 2022-03-04 ENCOUNTER — Emergency Department (HOSPITAL_COMMUNITY): Payer: Medicare Other

## 2022-03-04 ENCOUNTER — Emergency Department (HOSPITAL_COMMUNITY): Payer: Medicare Other | Admitting: Anesthesiology

## 2022-03-04 ENCOUNTER — Encounter (HOSPITAL_COMMUNITY): Admission: EM | Disposition: E | Payer: Self-pay | Source: Home / Self Care | Attending: Neurology

## 2022-03-04 ENCOUNTER — Inpatient Hospital Stay (HOSPITAL_COMMUNITY): Payer: Medicare Other

## 2022-03-04 DIAGNOSIS — I63412 Cerebral infarction due to embolism of left middle cerebral artery: Principal | ICD-10-CM | POA: Diagnosis present

## 2022-03-04 DIAGNOSIS — M810 Age-related osteoporosis without current pathological fracture: Secondary | ICD-10-CM | POA: Diagnosis present

## 2022-03-04 DIAGNOSIS — I10 Essential (primary) hypertension: Secondary | ICD-10-CM

## 2022-03-04 DIAGNOSIS — I9589 Other hypotension: Secondary | ICD-10-CM | POA: Diagnosis not present

## 2022-03-04 DIAGNOSIS — Z781 Physical restraint status: Secondary | ICD-10-CM

## 2022-03-04 DIAGNOSIS — E559 Vitamin D deficiency, unspecified: Secondary | ICD-10-CM | POA: Diagnosis present

## 2022-03-04 DIAGNOSIS — R451 Restlessness and agitation: Secondary | ICD-10-CM | POA: Diagnosis not present

## 2022-03-04 DIAGNOSIS — I63521 Cerebral infarction due to unspecified occlusion or stenosis of right anterior cerebral artery: Secondary | ICD-10-CM | POA: Diagnosis present

## 2022-03-04 DIAGNOSIS — Z66 Do not resuscitate: Secondary | ICD-10-CM | POA: Diagnosis not present

## 2022-03-04 DIAGNOSIS — Z9911 Dependence on respirator [ventilator] status: Secondary | ICD-10-CM

## 2022-03-04 DIAGNOSIS — F419 Anxiety disorder, unspecified: Secondary | ICD-10-CM | POA: Diagnosis present

## 2022-03-04 DIAGNOSIS — I63512 Cerebral infarction due to unspecified occlusion or stenosis of left middle cerebral artery: Principal | ICD-10-CM

## 2022-03-04 DIAGNOSIS — K219 Gastro-esophageal reflux disease without esophagitis: Secondary | ICD-10-CM | POA: Diagnosis present

## 2022-03-04 DIAGNOSIS — R1312 Dysphagia, oropharyngeal phase: Secondary | ICD-10-CM | POA: Diagnosis present

## 2022-03-04 DIAGNOSIS — H518 Other specified disorders of binocular movement: Secondary | ICD-10-CM | POA: Diagnosis present

## 2022-03-04 DIAGNOSIS — I959 Hypotension, unspecified: Secondary | ICD-10-CM | POA: Diagnosis not present

## 2022-03-04 DIAGNOSIS — Z20822 Contact with and (suspected) exposure to covid-19: Secondary | ICD-10-CM | POA: Diagnosis present

## 2022-03-04 DIAGNOSIS — J9601 Acute respiratory failure with hypoxia: Secondary | ICD-10-CM | POA: Diagnosis not present

## 2022-03-04 DIAGNOSIS — I480 Paroxysmal atrial fibrillation: Secondary | ICD-10-CM | POA: Diagnosis not present

## 2022-03-04 DIAGNOSIS — R0689 Other abnormalities of breathing: Secondary | ICD-10-CM | POA: Diagnosis not present

## 2022-03-04 DIAGNOSIS — E7800 Pure hypercholesterolemia, unspecified: Secondary | ICD-10-CM | POA: Diagnosis not present

## 2022-03-04 DIAGNOSIS — Z803 Family history of malignant neoplasm of breast: Secondary | ICD-10-CM

## 2022-03-04 DIAGNOSIS — I63511 Cerebral infarction due to unspecified occlusion or stenosis of right middle cerebral artery: Secondary | ICD-10-CM | POA: Diagnosis present

## 2022-03-04 DIAGNOSIS — M7989 Other specified soft tissue disorders: Secondary | ICD-10-CM | POA: Diagnosis not present

## 2022-03-04 DIAGNOSIS — R54 Age-related physical debility: Secondary | ICD-10-CM | POA: Diagnosis present

## 2022-03-04 DIAGNOSIS — R29724 NIHSS score 24: Secondary | ICD-10-CM | POA: Diagnosis present

## 2022-03-04 DIAGNOSIS — R414 Neurologic neglect syndrome: Secondary | ICD-10-CM | POA: Diagnosis present

## 2022-03-04 DIAGNOSIS — J9690 Respiratory failure, unspecified, unspecified whether with hypoxia or hypercapnia: Secondary | ICD-10-CM | POA: Diagnosis not present

## 2022-03-04 DIAGNOSIS — E440 Moderate protein-calorie malnutrition: Secondary | ICD-10-CM | POA: Diagnosis present

## 2022-03-04 DIAGNOSIS — G8191 Hemiplegia, unspecified affecting right dominant side: Secondary | ICD-10-CM | POA: Diagnosis present

## 2022-03-04 DIAGNOSIS — N289 Disorder of kidney and ureter, unspecified: Secondary | ICD-10-CM | POA: Diagnosis not present

## 2022-03-04 DIAGNOSIS — I6602 Occlusion and stenosis of left middle cerebral artery: Secondary | ICD-10-CM | POA: Diagnosis present

## 2022-03-04 DIAGNOSIS — E785 Hyperlipidemia, unspecified: Secondary | ICD-10-CM | POA: Diagnosis present

## 2022-03-04 DIAGNOSIS — H53461 Homonymous bilateral field defects, right side: Secondary | ICD-10-CM | POA: Diagnosis present

## 2022-03-04 DIAGNOSIS — Z880 Allergy status to penicillin: Secondary | ICD-10-CM

## 2022-03-04 DIAGNOSIS — Z886 Allergy status to analgesic agent status: Secondary | ICD-10-CM

## 2022-03-04 DIAGNOSIS — Z7189 Other specified counseling: Secondary | ICD-10-CM | POA: Diagnosis not present

## 2022-03-04 DIAGNOSIS — E876 Hypokalemia: Secondary | ICD-10-CM | POA: Diagnosis present

## 2022-03-04 DIAGNOSIS — J690 Pneumonitis due to inhalation of food and vomit: Secondary | ICD-10-CM | POA: Diagnosis not present

## 2022-03-04 DIAGNOSIS — Z515 Encounter for palliative care: Secondary | ICD-10-CM | POA: Diagnosis not present

## 2022-03-04 DIAGNOSIS — R29810 Facial weakness: Secondary | ICD-10-CM | POA: Diagnosis present

## 2022-03-04 DIAGNOSIS — R600 Localized edema: Secondary | ICD-10-CM | POA: Diagnosis not present

## 2022-03-04 DIAGNOSIS — G9340 Encephalopathy, unspecified: Secondary | ICD-10-CM | POA: Diagnosis not present

## 2022-03-04 DIAGNOSIS — Z79899 Other long term (current) drug therapy: Secondary | ICD-10-CM

## 2022-03-04 DIAGNOSIS — I482 Chronic atrial fibrillation, unspecified: Secondary | ICD-10-CM | POA: Diagnosis not present

## 2022-03-04 DIAGNOSIS — Z888 Allergy status to other drugs, medicaments and biological substances status: Secondary | ICD-10-CM

## 2022-03-04 DIAGNOSIS — R4701 Aphasia: Secondary | ICD-10-CM | POA: Diagnosis present

## 2022-03-04 DIAGNOSIS — Z7982 Long term (current) use of aspirin: Secondary | ICD-10-CM

## 2022-03-04 DIAGNOSIS — Z6822 Body mass index (BMI) 22.0-22.9, adult: Secondary | ICD-10-CM

## 2022-03-04 DIAGNOSIS — Z7983 Long term (current) use of bisphosphonates: Secondary | ICD-10-CM

## 2022-03-04 HISTORY — PX: IR PERCUTANEOUS ART THROMBECTOMY/INFUSION INTRACRANIAL INC DIAG ANGIO: IMG6087

## 2022-03-04 HISTORY — PX: RADIOLOGY WITH ANESTHESIA: SHX6223

## 2022-03-04 HISTORY — PX: IR CT HEAD LTD: IMG2386

## 2022-03-04 LAB — COMPREHENSIVE METABOLIC PANEL
ALT: 10 U/L (ref 0–44)
AST: 15 U/L (ref 15–41)
Albumin: 3.4 g/dL — ABNORMAL LOW (ref 3.5–5.0)
Alkaline Phosphatase: 69 U/L (ref 38–126)
Anion gap: 7 (ref 5–15)
BUN: 16 mg/dL (ref 8–23)
CO2: 23 mmol/L (ref 22–32)
Calcium: 9.6 mg/dL (ref 8.9–10.3)
Chloride: 108 mmol/L (ref 98–111)
Creatinine, Ser: 0.99 mg/dL (ref 0.44–1.00)
GFR, Estimated: 56 mL/min — ABNORMAL LOW (ref >60)
Glucose, Bld: 110 mg/dL — ABNORMAL HIGH (ref 70–99)
Potassium: 3.8 mmol/L (ref 3.5–5.1)
Sodium: 138 mmol/L (ref 135–145)
Total Bilirubin: 0.6 mg/dL (ref 0.3–1.2)
Total Protein: 6.2 g/dL — ABNORMAL LOW (ref 6.5–8.1)

## 2022-03-04 LAB — CBC
HCT: 35.6 % — ABNORMAL LOW (ref 36.0–46.0)
HCT: 34 % — ABNORMAL LOW (ref 36.0–46.0)
Hemoglobin: 11.7 g/dL — ABNORMAL LOW (ref 12.0–15.0)
Hemoglobin: 11.4 g/dL — ABNORMAL LOW (ref 12.0–15.0)
MCH: 33.9 pg (ref 26.0–34.0)
MCH: 33.8 pg (ref 26.0–34.0)
MCHC: 32.9 g/dL (ref 30.0–36.0)
MCHC: 33.5 g/dL (ref 30.0–36.0)
MCV: 103.2 fL — ABNORMAL HIGH (ref 80.0–100.0)
MCV: 100.9 fL — ABNORMAL HIGH (ref 80.0–100.0)
Platelets: 213 10*3/uL (ref 150–400)
Platelets: 213 K/uL (ref 150–400)
RBC: 3.45 MIL/uL — ABNORMAL LOW (ref 3.87–5.11)
RBC: 3.37 MIL/uL — ABNORMAL LOW (ref 3.87–5.11)
RDW: 13.4 % (ref 11.5–15.5)
RDW: 13.3 % (ref 11.5–15.5)
WBC: 6.5 10*3/uL (ref 4.0–10.5)
WBC: 7.6 K/uL (ref 4.0–10.5)
nRBC: 0 % (ref 0.0–0.2)
nRBC: 0 % (ref 0.0–0.2)

## 2022-03-04 LAB — POCT I-STAT 7, (LYTES, BLD GAS, ICA,H+H)
Acid-base deficit: 2 mmol/L (ref 0.0–2.0)
Bicarbonate: 22.3 mmol/L (ref 20.0–28.0)
Calcium, Ion: 1.27 mmol/L (ref 1.15–1.40)
HCT: 32 % — ABNORMAL LOW (ref 36.0–46.0)
Hemoglobin: 10.9 g/dL — ABNORMAL LOW (ref 12.0–15.0)
O2 Saturation: 99 %
Patient temperature: 98.6
Potassium: 3.4 mmol/L — ABNORMAL LOW (ref 3.5–5.1)
Sodium: 140 mmol/L (ref 135–145)
TCO2: 23 mmol/L (ref 22–32)
pCO2 arterial: 34.5 mmHg (ref 32–48)
pH, Arterial: 7.417 (ref 7.35–7.45)
pO2, Arterial: 147 mmHg — ABNORMAL HIGH (ref 83–108)

## 2022-03-04 LAB — I-STAT CHEM 8, ED
BUN: 18 mg/dL (ref 8–23)
Calcium, Ion: 1.31 mmol/L (ref 1.15–1.40)
Chloride: 103 mmol/L (ref 98–111)
Creatinine, Ser: 1 mg/dL (ref 0.44–1.00)
Glucose, Bld: 108 mg/dL — ABNORMAL HIGH (ref 70–99)
HCT: 35 % — ABNORMAL LOW (ref 36.0–46.0)
Hemoglobin: 11.9 g/dL — ABNORMAL LOW (ref 12.0–15.0)
Potassium: 3.8 mmol/L (ref 3.5–5.1)
Sodium: 140 mmol/L (ref 135–145)
TCO2: 27 mmol/L (ref 22–32)

## 2022-03-04 LAB — RESP PANEL BY RT-PCR (FLU A&B, COVID) ARPGX2
Influenza A by PCR: NEGATIVE
Influenza B by PCR: NEGATIVE
SARS Coronavirus 2 by RT PCR: NEGATIVE

## 2022-03-04 LAB — PROTIME-INR
INR: 1.1 (ref 0.8–1.2)
Prothrombin Time: 13.9 seconds (ref 11.4–15.2)

## 2022-03-04 LAB — DIFFERENTIAL
Abs Immature Granulocytes: 0.03 10*3/uL (ref 0.00–0.07)
Basophils Absolute: 0.1 10*3/uL (ref 0.0–0.1)
Basophils Relative: 1 %
Eosinophils Absolute: 0.5 10*3/uL (ref 0.0–0.5)
Eosinophils Relative: 8 %
Immature Granulocytes: 1 %
Lymphocytes Relative: 19 %
Lymphs Abs: 1.3 10*3/uL (ref 0.7–4.0)
Monocytes Absolute: 0.8 10*3/uL (ref 0.1–1.0)
Monocytes Relative: 13 %
Neutro Abs: 3.8 10*3/uL (ref 1.7–7.7)
Neutrophils Relative %: 58 %

## 2022-03-04 LAB — APTT: aPTT: 29 seconds (ref 24–36)

## 2022-03-04 LAB — HEMOGLOBIN A1C
Hgb A1c MFr Bld: 5.7 % — ABNORMAL HIGH (ref 4.8–5.6)
Mean Plasma Glucose: 116.89 mg/dL

## 2022-03-04 LAB — MRSA NEXT GEN BY PCR, NASAL: MRSA by PCR Next Gen: NOT DETECTED

## 2022-03-04 LAB — ETHANOL: Alcohol, Ethyl (B): <10 mg/dL (ref <10)

## 2022-03-04 LAB — CBG MONITORING, ED: Glucose-Capillary: 120 mg/dL — ABNORMAL HIGH (ref 70–99)

## 2022-03-04 SURGERY — IR WITH ANESTHESIA
Anesthesia: General

## 2022-03-04 MED ORDER — ACETAMINOPHEN 325 MG PO TABS: 650.0000 mg | ORAL_TABLET | ORAL | Status: DC | PRN

## 2022-03-04 MED ORDER — SUCCINYLCHOLINE CHLORIDE 200 MG/10ML IV SOSY
PREFILLED_SYRINGE | INTRAVENOUS | Status: DC | PRN
  Administered 2022-03-04: 50 mg via INTRAVENOUS

## 2022-03-04 MED ORDER — CLEVIDIPINE BUTYRATE 0.5 MG/ML IV EMUL
0.0000 mg/h | INTRAVENOUS | Status: AC
  Administered 2022-03-04: 5 mg/h via INTRAVENOUS
  Administered 2022-03-04: 3 mg/h via INTRAVENOUS
  Administered 2022-03-05: 8 mg/h via INTRAVENOUS
  Filled 2022-03-04 (×3): qty 50

## 2022-03-04 MED ORDER — IOHEXOL 300 MG/ML  SOLN
100.0000 mL | Freq: Once | INTRAMUSCULAR | Status: AC | PRN
  Administered 2022-03-04: 90 mL via INTRA_ARTERIAL

## 2022-03-04 MED ORDER — PROPOFOL 1000 MG/100ML IV EMUL
5.0000 ug/kg/min | INTRAVENOUS | Status: DC
  Administered 2022-03-04: 50 ug/kg/min via INTRAVENOUS
  Administered 2022-03-05: 40 ug/kg/min via INTRAVENOUS
  Administered 2022-03-05: 50 ug/kg/min via INTRAVENOUS
  Administered 2022-03-05: 70 ug/kg/min via INTRAVENOUS
  Filled 2022-03-04 (×5): qty 100

## 2022-03-04 MED ORDER — PROPOFOL 10 MG/ML IV BOLUS
INTRAVENOUS | Status: DC | PRN
  Administered 2022-03-04: 70 mg via INTRAVENOUS
  Administered 2022-03-04 (×2): 30 mg via INTRAVENOUS
  Administered 2022-03-04: 20 mg via INTRAVENOUS
  Administered 2022-03-04: 30 mg via INTRAVENOUS
  Administered 2022-03-04 (×2): 20 mg via INTRAVENOUS
  Administered 2022-03-04: 30 mg via INTRAVENOUS

## 2022-03-04 MED ORDER — SODIUM CHLORIDE 0.9 % IV SOLN: INTRAVENOUS | Status: DC

## 2022-03-04 MED ORDER — PROPOFOL 500 MG/50ML IV EMUL
INTRAVENOUS | Status: DC | PRN
Start: 2022-03-04 — End: 2022-03-04
  Administered 2022-03-04: 50 ug/kg/min via INTRAVENOUS

## 2022-03-04 MED ORDER — LABETALOL HCL 5 MG/ML IV SOLN
20.0000 mg | Freq: Once | INTRAVENOUS | Status: AC
  Administered 2022-03-07: 20 mg via INTRAVENOUS
  Filled 2022-03-04: qty 4

## 2022-03-04 MED ORDER — SIMVASTATIN 20 MG PO TABS
20.0000 mg | ORAL_TABLET | Freq: Every evening | ORAL | Status: DC
  Filled 2022-03-04: qty 1

## 2022-03-04 MED ORDER — ASPIRIN 81 MG PO CHEW: 81.0000 mg | CHEWABLE_TABLET | Freq: Every day | ORAL | Status: DC

## 2022-03-04 MED ORDER — TICAGRELOR 90 MG PO TABS
ORAL_TABLET | ORAL | Status: AC
  Filled 2022-03-04: qty 2

## 2022-03-04 MED ORDER — TENECTEPLASE FOR STROKE
0.2500 mg/kg | PACK | Freq: Once | INTRAVENOUS | Status: AC
  Administered 2022-03-04: 13 mg via INTRAVENOUS
  Filled 2022-03-04: qty 10

## 2022-03-04 MED ORDER — LACTATED RINGERS IV SOLN: INTRAVENOUS | Status: DC | PRN

## 2022-03-04 MED ORDER — ALPRAZOLAM 0.25 MG PO TABS: 0.2500 mg | ORAL_TABLET | Freq: Two times a day (BID) | ORAL | Status: DC | PRN

## 2022-03-04 MED ORDER — EPTIFIBATIDE 20 MG/10ML IV SOLN
INTRAVENOUS | Status: AC | PRN
  Administered 2022-03-04 (×2): 2 mg via INTRAVENOUS
  Administered 2022-03-04: 1.5 mg via INTRAVENOUS
  Administered 2022-03-04: 2 mg via INTRAVENOUS

## 2022-03-04 MED ORDER — SENNOSIDES-DOCUSATE SODIUM 8.6-50 MG PO TABS: 1.0000 | ORAL_TABLET | Freq: Every evening | ORAL | Status: DC | PRN

## 2022-03-04 MED ORDER — CEFAZOLIN SODIUM-DEXTROSE 2-4 GM/100ML-% IV SOLN
INTRAVENOUS | Status: AC
  Filled 2022-03-04: qty 100

## 2022-03-04 MED ORDER — TICAGRELOR 90 MG PO TABS
90.0000 mg | ORAL_TABLET | Freq: Two times a day (BID) | ORAL | Status: DC
  Administered 2022-03-05 – 2022-03-09 (×9): 90 mg
  Filled 2022-03-04 (×8): qty 1

## 2022-03-04 MED ORDER — CHLORHEXIDINE GLUCONATE CLOTH 2 % EX PADS
6.0000 | MEDICATED_PAD | Freq: Every day | CUTANEOUS | Status: DC
  Administered 2022-03-05 – 2022-03-09 (×4): 6 via TOPICAL

## 2022-03-04 MED ORDER — ACETAMINOPHEN 650 MG RE SUPP: 650.0000 mg | RECTAL | Status: DC | PRN

## 2022-03-04 MED ORDER — ACETAMINOPHEN 160 MG/5ML PO SOLN: 650.0000 mg | ORAL | Status: DC | PRN

## 2022-03-04 MED ORDER — ASPIRIN 81 MG PO CHEW
CHEWABLE_TABLET | ORAL | Status: AC
  Filled 2022-03-04: qty 1

## 2022-03-04 MED ORDER — VANCOMYCIN HCL IN DEXTROSE 1-5 GM/200ML-% IV SOLN
INTRAVENOUS | Status: AC
  Filled 2022-03-04: qty 200

## 2022-03-04 MED ORDER — ASPIRIN 325 MG PO TABS
ORAL_TABLET | ORAL | Status: AC | PRN
  Administered 2022-03-04: 81 mg

## 2022-03-04 MED ORDER — NITROGLYCERIN 1 MG/10 ML FOR IR/CATH LAB
INTRA_ARTERIAL | Status: AC
  Filled 2022-03-04: qty 10

## 2022-03-04 MED ORDER — NITROGLYCERIN 5 MG/ML IV SOLN
INTRAVENOUS | Status: AC | PRN
  Administered 2022-03-04 (×3): 25 ug via INTRA_ARTERIAL

## 2022-03-04 MED ORDER — PANTOPRAZOLE SODIUM 40 MG IV SOLR
40.0000 mg | Freq: Every day | INTRAVENOUS | Status: DC
  Administered 2022-03-04 – 2022-03-08 (×5): 40 mg via INTRAVENOUS
  Filled 2022-03-04 (×5): qty 10

## 2022-03-04 MED ORDER — DEXAMETHASONE SODIUM PHOSPHATE 10 MG/ML IJ SOLN
INTRAMUSCULAR | Status: DC | PRN
  Administered 2022-03-04: 5 mg via INTRAVENOUS

## 2022-03-04 MED ORDER — ORAL CARE MOUTH RINSE
15.0000 mL | OROMUCOSAL | Status: DC
  Administered 2022-03-04 – 2022-03-05 (×10): 15 mL via OROMUCOSAL

## 2022-03-04 MED ORDER — PHENYLEPHRINE HCL-NACL 20-0.9 MG/250ML-% IV SOLN
INTRAVENOUS | Status: DC | PRN
  Administered 2022-03-04: 30 ug/min via INTRAVENOUS

## 2022-03-04 MED ORDER — LIDOCAINE HCL (CARDIAC) PF 100 MG/5ML IV SOSY: PREFILLED_SYRINGE | INTRAVENOUS | Status: DC | PRN

## 2022-03-04 MED ORDER — ASPIRIN 81 MG PO CHEW
81.0000 mg | CHEWABLE_TABLET | Freq: Every day | ORAL | Status: DC
  Administered 2022-03-05 – 2022-03-09 (×5): 81 mg
  Filled 2022-03-04 (×5): qty 1

## 2022-03-04 MED ORDER — CHLORHEXIDINE GLUCONATE 0.12% ORAL RINSE (MEDLINE KIT)
15.0000 mL | Freq: Two times a day (BID) | OROMUCOSAL | Status: DC
  Administered 2022-03-04 – 2022-03-09 (×10): 15 mL via OROMUCOSAL

## 2022-03-04 MED ORDER — LIDOCAINE 2% (20 MG/ML) 5 ML SYRINGE
INTRAMUSCULAR | Status: DC | PRN
Start: 2022-03-04 — End: 2022-03-04
  Administered 2022-03-04: 60 mg via INTRAVENOUS

## 2022-03-04 MED ORDER — EPHEDRINE SULFATE-NACL 50-0.9 MG/10ML-% IV SOSY
PREFILLED_SYRINGE | INTRAVENOUS | Status: DC | PRN
  Administered 2022-03-04 (×2): 2.5 mg via INTRAVENOUS

## 2022-03-04 MED ORDER — PHENYLEPHRINE 40 MCG/ML (10ML) SYRINGE FOR IV PUSH (FOR BLOOD PRESSURE SUPPORT)
PREFILLED_SYRINGE | INTRAVENOUS | Status: DC | PRN
  Administered 2022-03-04 (×2): 40 ug via INTRAVENOUS
  Administered 2022-03-04: 80 ug via INTRAVENOUS

## 2022-03-04 MED ORDER — PROPOFOL 1000 MG/100ML IV EMUL: 5.0000 ug/kg/min | INTRAVENOUS | Status: DC

## 2022-03-04 MED ORDER — TICAGRELOR 90 MG PO TABS
90.0000 mg | ORAL_TABLET | Freq: Two times a day (BID) | ORAL | Status: DC
  Filled 2022-03-04: qty 1

## 2022-03-04 MED ORDER — ROCURONIUM BROMIDE 10 MG/ML (PF) SYRINGE
PREFILLED_SYRINGE | INTRAVENOUS | Status: DC | PRN
  Administered 2022-03-04: 30 mg via INTRAVENOUS
  Administered 2022-03-04: 50 mg via INTRAVENOUS
  Administered 2022-03-04 (×2): 20 mg via INTRAVENOUS

## 2022-03-04 MED ORDER — CLEVIDIPINE BUTYRATE 0.5 MG/ML IV EMUL
0.0000 mg/h | INTRAVENOUS | Status: DC
  Administered 2022-03-04: 2 mg/h via INTRAVENOUS
  Administered 2022-03-05: 12 mg/h via INTRAVENOUS
  Administered 2022-03-05: 6 mg/h via INTRAVENOUS
  Administered 2022-03-05: 11 mg/h via INTRAVENOUS
  Filled 2022-03-04 (×3): qty 50

## 2022-03-04 MED ORDER — EPTIFIBATIDE 20 MG/10ML IV SOLN
INTRAVENOUS | Status: AC
Start: 2022-03-04 — End: 2022-03-05
  Filled 2022-03-04: qty 10

## 2022-03-04 MED ORDER — IOHEXOL 350 MG/ML SOLN
60.0000 mL | Freq: Once | INTRAVENOUS | Status: AC | PRN
  Administered 2022-03-04: 60 mL via INTRAVENOUS

## 2022-03-04 MED ORDER — CLEVIDIPINE BUTYRATE 0.5 MG/ML IV EMUL
INTRAVENOUS | Status: AC
  Administered 2022-03-05: 8 mg/h via INTRAVENOUS
  Filled 2022-03-04: qty 50

## 2022-03-04 MED ORDER — ONDANSETRON HCL 4 MG/2ML IJ SOLN
INTRAMUSCULAR | Status: DC | PRN
  Administered 2022-03-04: 4 mg via INTRAVENOUS

## 2022-03-04 MED ORDER — SODIUM CHLORIDE 0.9 % IV SOLN
INTRAVENOUS | Status: DC | PRN
  Administered 2022-03-04: 1000 mg via INTRAVENOUS

## 2022-03-04 MED ORDER — STROKE: EARLY STAGES OF RECOVERY BOOK
Freq: Once | Status: DC
  Filled 2022-03-04 (×2): qty 1

## 2022-03-04 MED ORDER — PHENYLEPHRINE HCL-NACL 20-0.9 MG/250ML-% IV SOLN: 0.0000 ug/min | INTRAVENOUS | Status: DC

## 2022-03-04 MED ORDER — TICAGRELOR 60 MG PO TABS
ORAL_TABLET | ORAL | Status: AC | PRN
  Administered 2022-03-04: 180 mg

## 2022-03-04 MED ORDER — PHENYLEPHRINE HCL (PRESSORS) 10 MG/ML IV SOLN
INTRAVENOUS | Status: DC | PRN
  Administered 2022-03-04: 40 ug via INTRAVENOUS

## 2022-03-04 NOTE — H&P (Signed)
NEUROLOGY CONSULTATION NOTE  ? ?Date of service: March 04, 2022 ?Patient Name: Elizabeth Brock ?MRN:  [id number] ?DOB:  21-Aug-1933 ? ?_ _ _   _ __   _ __ _ _  __ __   _ __   __ _ ? ?History of Present Illness  ?TERE BAAR is a 88 y.o. female with PMH significant for GERD, hypertension, hyperlipidemia, osteoporosis, vitamin D deficiency who presents with sudden onset right-sided weakness, left gaze deviation, right hemianopsia. ? ?She woke up this morning and was at her baseline.  Usually she is able to walk around by herself, uses her iPad and is able to read.  She is able to dress and dress herself, shower by herself and can fix herself light meals.  Husband last saw her at her baseline at 80 00 on 03/06/2022.  He went out to run some errands and return to find her unresponsive on the floor.  He called EMS who noted the right-sided weakness and brought her in as a code stroke. ? ?LKW: 1000 on 03/05/2022 ?mRS: 1 ?tNKASE: yes offered. Discussed risks and benefits with patient;s son Arnette Norris and daughter Cerina and they undesrtand elevated risks of tNKASE given her advaned age and agreed to [place]. ?Thrombectomy: yes offered. Dr. Estanislado Pandy discussed details of the procedure along with risks and benefits with patient's daughter and son and they consented to thrombectomy. ? ?NIHSS components Score: Comment  ?1a Level of Conscious 0[x]  1[]  2[]  3[]      ?1b LOC Questions 0[]  1[]  2[x]       ?1c LOC Commands 0[]  1[]  2[x]       ?2 Best Gaze 0[]  1[]  2[x]       ?3 Visual 0[]  1[]  2[x]  3[]      ?4 Facial Palsy 0[]  1[]  2[x]  3[]      ?5a Motor Arm - left 0[x]  1[]  2[]  3[]  4[]  UN[]    ?5b Motor Arm - Right 0[]  1[]  2[]  3[]  4[x]  UN[]    ?6a Motor Leg - Left 0[x]  1[]  2[]  3[]  4[]  UN[]    ?6b Motor Leg - Right 0[]  1[]  2[x]  3[]  4[]  UN[]    ?7 Limb Ataxia 0[x]  1[]  2[]  3[]  UN[]     ?8 Sensory 0[]  1[]  2[x]  UN[]      ?9 Best Language 0[]  1[]  2[]  3[x]      ?10 Dysarthria 0[]  1[]  2[x]  UN[]      ?11 Extinct. and Inattention 0[]  1[x]  2[]       ?TOTAL: 24   ?   ?ROS  ? ?Unable to obtain due to review of system secondary to aphasia ? ?Past History  ? ?Past Medical History:  ?Diagnosis Date  ? Cataract   ? GERD (gastroesophageal reflux disease)   ? Hyperlipemia   ? Hypertension   ? Osteoporosis   ? Renal disorder   ? Vitamin D deficiency disease   ? ?Past Surgical History:  ?Procedure Laterality Date  ? APPENDECTOMY    ? ?Family History  ?Problem Relation Age of Onset  ? Breast cancer Sister 74  ?     Twin Sister  ? ?Social History  ? ?Socioeconomic History  ? Marital status: Married  ?  Spouse name: Not on file  ? Number of children: Not on file  ? Years of education: Not on file  ? Highest education level: Not on file  ?Occupational History  ? Not on file  ?Tobacco Use  ? Smoking status: Never  ? Smokeless tobacco: Never  ?Substance and Sexual Activity  ? Alcohol use: No  ?  Drug use: Not on file  ? Sexual activity: Not on file  ?Other Topics Concern  ? Not on file  ?Social History Narrative  ? Tobacco use cigarettes: Never smoked last updated 09/20/2013  ? No smoking   ? No tobacco exposure  ? No alcohol  ? Caffeine: yes   ? No recreational drug use  ? No diet  ? No exercise  ? Occupation :retired  ? Martial status: married  ? Children: four  ? ?Social Determinants of Health  ? ?Financial Resource Strain: Not on file  ?Food Insecurity: Not on file  ?Transportation Needs: Not on file  ?Physical Activity: Not on file  ?Stress: Not on file  ?Social Connections: Not on file  ? ?Allergies  ?Allergen Reactions  ? Aspirin Nausea And Vomiting  ?  Uncoated aspirin  ? Cortisone Hives  ?  Hives around neck.  ? Penicillins Hives  ?  Hives around ears.  ? ? ?Medications  ?(Not in a hospital admission) ?  ? ?Vitals  ? ?Vitals:  ? 03/12/2022 1200  ?Weight: 53.2 kg  ?  ? ?There is no height or weight on file to calculate BMI. ? ?Physical Exam  ? ?General: Laying comfortably in bed; in no acute distress.  ?HENT: Normal oropharynx and mucosa. Normal external appearance of ears and nose.   ?Neck: Supple, no pain or tenderness  ?CV: No JVD. No peripheral edema.  ?Pulmonary: Symmetric Chest rise. Normal respiratory effort.  ?Abdomen: Soft to touch, non-tender.  ?Ext: No cyanosis, edema, or deformity  ?Skin: No rash. Normal palpation of skin.   ?Musculoskeletal: Normal digits and nails by inspection. No clubbing.  ? ?Neurologic Examination  ?Mental status/Cognition: Alert, does not answer any orientation questions.  Poor attention.  She is looking around and appreciate things on the left side ?Speech/language: Global aphasia with no speech.  Unable to comprehend, unable to identify objects, unable to repeat.   ?Cranial nerves:  ? CN II Pupils equal and reactive to light, right hemianopsia.  ? CN III,IV,VI Left gaze deviation.  ? CN V Corneals intact bilaterally.  ? CN VII Right facial droop.  ? CN VIII Does not turn head towards speech.  ? CN IX & X Unable to asses.  ? CN XI   ? CN XII midline tongue, doesnot protrude on command.  ? ?Motor:  ?Muscle bulk: poor, tone flacced in RUE ?Unable to do detailed strength testing secondary to aphasia and poor participation.  Patient will hold her left upper extremity off the bed for more than 10 seconds with no drift.  She will hold her left lower extremity off the bed for more than 5 seconds with no drift.  She is flaccid in right upper extremity with no movement.  Drift in right lower extremity. ? ?Sensation: Sensory loss in right upper extremity and right lower extremity.   ? ?Coordination/Complex Motor:  ?Unable to assess. ?- Gait: deferred for patient safety. ? ?Labs  ? ?CBC:  ?Recent Labs  ?Lab 02/27/2022 ?1221 03/12/2022 ?1226  ?WBC 6.5  --   ?NEUTROABS 3.8  --   ?HGB 11.7* 11.9*  ?HCT 35.6* 35.0*  ?MCV 103.2*  --   ?PLT 213  --   ? ? ?Basic Metabolic Panel:  ?Lab Results  ?Component Value Date  ? NA 140 03/06/2022  ? K 3.8 03/06/2022  ? CO2 24 09/19/2014  ? GLUCOSE 108 (H) 03/10/2022  ? BUN 18 02/19/2022  ? CREATININE 1.00 03/09/2022  ? CALCIUM 9.2 09/19/2014   ?  GFRNONAA 28 (L) 09/19/2014  ? GFRAA 33 (L) 09/19/2014  ? ?Lipid Panel: No results found for: [place] ?HgbA1c: No results found for: HGBA1C ?Urine Drug Screen: No results found for: LABOPIA, COCAINSCRNUR, [place], [place], THCU, LABBARB  ?Alcohol Level No results found for: ETH ? ?CT Head without contrast(Personally reviewed): ?CTH was negative for a large hypodensity concerning for a large territory infarct or hyperdensity concerning for an ICH ? ?CT angio Head and Neck with contrast(Personally reviewed): ?Distal L MCA M1 occlusion. ? ?MRI Brain: ?pending ? ?Impression  ? ?ELIKA KAMPF is a 88 y.o. female with PMH significant for GERD, hypertension, hyperlipidemia, osteoporosis, vitamin D deficiency who presents with sudden onset right-sided weakness, left gaze deviation, right hemianopsia.  Found to have a left MCA stroke with left M1 occlusion.  She was offered TNKase.  She was taken for thrombectomy. ? ?Primary Diagnosis:  ?Cerebral infarction due to embolism of  left middle cerebral artery.  ? ?Secondary Diagnosis: ?Essential (primary) hypertension ? ?Recommendations  ? ?Acute left MCA stroke with left MCA M1 occlusion status post TNKase and going to thrombectomy: ?Plan: ?- Frequent NeuroChecks for post tNK care per stroke unit protocol: ?- Initial CTH demonstrated no acute hemorrhage or mass ?- MRI Brain - pending ?- CTA - L MCA M1 occlusion ?- TTE - pending ?- Lipid Panel: LDL - pending ? - Statin: ontinue home simvastatin ?- HbA1c: pending. ?- Antithrombotic: Start ASA 81 mg daily if 24 h CTH does not show acute hemorrhage ?- DVT prophylaxis: SCDs. Pharmacologic prophylaxis if 24 h CTH does not demonstrate acute hemorrhage ?- Systolic Blood Pressure goal: < 180 mm Hg ?- Telemetry monitoring for arrhythmia: 72 hours ?- Swallow screen - ordered ?- PT/OT/SLP consults ? ?Hypertension: ?Hold home medications for now.  Blood pressure goal as above.  Will use as needed labetalol and Cleviprex for blood  pressure control. ? ?Hyperlipidemia: ?LDL is pending. ?Continue home simvastatin. ? ?GERD: ?Continue home PPI. ? ?This patient is critically ill and at significant risk of neurological worsening, death and care

## 2022-03-04 NOTE — Anesthesia Preprocedure Evaluation (Signed)
Anesthesia Evaluation  ?Patient identified by MRN, date of birth, ID band ?Patient unresponsive ? ? ? ?Reviewed: ?Allergy & Precautions, Patient's Chart, lab work & pertinent test results, Unable to perform ROS - Chart review onlyPreop documentation limited or incomplete due to emergent nature of procedure. ? ?Airway ?Mallampati: II ? ?TM Distance: >3 FB ?Neck ROM: Full ? ? ? Dental ? ?(+) Edentulous Upper, Missing, Partial Lower, Dental Advisory Given, Poor Dentition ?  ?Pulmonary ?neg pulmonary ROS,  ?  ?Pulmonary exam normal ?breath sounds clear to auscultation ? ? ? ? ? ? Cardiovascular ?hypertension, Normal cardiovascular exam ?Rhythm:Regular Rate:Normal ? ? ?  ?Neuro/Psych ?CVA   ? GI/Hepatic ?Neg liver ROS, GERD  ,  ?Endo/Other  ?negative endocrine ROS ? Renal/GU ?Renal disease  ? ?  ?Musculoskeletal ?negative musculoskeletal ROS ?(+)  ? Abdominal ?  ?Peds ? Hematology ?negative hematology ROS ?(+)   ?Anesthesia Other Findings ? ? Reproductive/Obstetrics ? ?  ? ? ? ? ? ? ? ? ? ? ? ? ? ?  ?  ? ? ? ? ? ? ? ?Anesthesia Physical ?Anesthesia Plan ? ?ASA: 3 and emergent ? ?Anesthesia Plan: General  ? ?Post-op Pain Management: Minimal or no pain anticipated  ? ?Induction: Intravenous ? ?PONV Risk Score and Plan: 3 and Ondansetron, Dexamethasone and Treatment may vary due to age or medical condition ? ?Airway Management Planned: Oral ETT ? ?Additional Equipment: Arterial line ? ?Intra-op Plan:  ? ?Post-operative Plan: Possible Post-op intubation/ventilation ? ?Informed Consent: I have reviewed the patients History and Physical, chart, labs and discussed the procedure including the risks, benefits and alternatives for the proposed anesthesia with the patient or authorized representative who has indicated his/her understanding and acceptance.  ? ? ? ?Dental advisory given ? ?Plan Discussed with: CRNA ? ?Anesthesia Plan Comments:   ? ? ? ? ? ? ?Anesthesia Quick Evaluation ? ?

## 2022-03-04 NOTE — Transfer of Care (Addendum)
Immediate Anesthesia Transfer of Care Note ? ?Patient: Elizabeth Brock ? ?Procedure(s) Performed: IR PERCUTANEOUS ART THROMBECTOMY/INFUSION INTRACRANIAL INC DIAG ANGIO ?IR CT HEAD LTD ? ?Patient Location: PACU ? ?Anesthesia Type:General ? ?Level of Consciousness: Patient remains intubated per anesthesia plan ? ?Airway & Oxygen Therapy: Patient remains intubated per anesthesia plan and Patient placed on Ventilator (see vital sign flow sheet for setting) ? ?Post-op Assessment: Report given to RN and Post -op Vital signs reviewed and stable ? ?Post vital signs: Reviewed and stable ? ?Last Vitals:  ?Vitals Value Taken Time  ?BP 132/63 Mar 16, 2022 1702  ?Temp    ?Pulse 71 03/16/2022 1703  ?Resp 15 Mar 16, 2022 1703  ?SpO2 100 % 2022-03-16 1703  ?Vitals shown include unvalidated device data. ? ?Last Pain: There were no vitals filed for this visit.   ? ?  ? ?Complications: No notable events documented. ?

## 2022-03-04 NOTE — Consult Note (Signed)
? ?NAME:  Elizabeth Brock, MRN:  [id number], DOB:  September 13, 1933, LOS: 0 ?ADMISSION DATE:  03/03/2022, CONSULTATION DATE:  03/09/2022 ?REFERRING MD:  Dr Alferd Patee, CHIEF COMPLAINT:  Stroke ? ?History of Present Illness:  ?88 yo female admitted with L MCA stroke, s/p TNK and thrombectomy ? ?Patient presented to the ED with complaints of right sided weakness associated with left gaze preference and right hemianopsia.  NIHSS: 24.  Stroke protocol initiated, neurology consulted, patient was found to have L M1 occlusion.  Patient received TNK and then proceeded with thrombectomy with a total of 3 passes. TICI 2B. Post procedure CTH with contrast staining.  Right femoral site closed with angio-seal  ? ?Patient evaluated in PACU. Intubated and sedated, on propofol infusion.  PERRL, + cough/gag.  Not following commands. Bilateral DP palpable and warm. No bleeding at femoral site.   ? ?Pertinent  Medical History  ?GERD ?Hypertension ?Hyperlipidemia ?Osteoporosis ?Vitamin D Deficiency ? ?Significant Hospital Events: ?Including procedures, antibiotic start and stop dates in addition to other pertinent events   ?4/14: TNK and thrombectomy. Intubated ? ?Interim History / Subjective:  ?New admit ? ?Objective   ?Blood pressure (!) 141/74, weight 53.2 kg. ?   ?   ? ?Intake/Output Summary (Last 24 hours) at 03/17/2022 1649 ?Last data filed at 02/20/2022 1621 ?Gross per 24 hour  ?Intake 1450 ml  ?Output 950 ml  ?Net 500 ml  ? ?Filed Weights  ? 03/11/2022 1200  ?Weight: 53.2 kg  ? ? ?Examination: ?General: Intubated and sedated on mechanical ventilation  ?HENT: Normocephalic and atraumatic ?Lungs: Symmetric lung expansion, synchronous with ventilator, no secretions via ETT ?Cardiovascular: SR on telemetry, S1/S2, no edema.  ?Abdomen: soft, non tender, non distended ?Extremities: no deformities ?Neuro: Sedated, PERRL.  ? ?Resolved Hospital Problem list   ? ? ?Assessment & Plan:  ?Acute left MCA stroke s/p TNK and thrombectomy of left M1  occlusion ?Post TNK care: no antiplt/anticoag x 24 hours. 24 hour imaging post TNK administration, sooner if neurological change ?Secondary stroke work up: TTE, lipid panel, a1c ?No ASA today due to TNK ?Statin resumed ?Goal SBP < 180 and DBP < 110 ?Right femoral site with dressing intact, drainage outlined.  ?Brain MRI pending ? ?Hypertension ?Goal SBP < 180 ?Home meds: Norvasc 5 mg BID, Cozaar 100 mg daily, Bisopropolol-HCTZ 10-6.25 BID ?PRN labetalol and cleviprex available, not requiring  ? ?Intubated for airway protection ?Flu/COVID negative ?CXR and ABG pending ?Continue full vent support, unable to lift HOB until 2100 ? ?Hyperlipidemia ?Home statin  ? ?GERD ?PPI ? ?Anxiety ?Home meds: Ativan 0.25 mg BID PRN, zoloft 100 mg daily, consider resuming once extubated ? ?Best Practice (right click and "Reselect all SmartList Selections" daily)  ? ?Diet/type: NPO ?DVT prophylaxis: other ?GI prophylaxis: PPI ?Lines: N/A ?Foley:  Yes, and it is still needed ?Code Status:  full code ?Last date of multidisciplinary goals of care discussion [per primary team] ? ?Labs   ?CBC: ?Recent Labs  ?Lab 02/20/2022 ?1221 03/03/2022 ?1226  ?WBC 6.5  --   ?NEUTROABS 3.8  --   ?HGB 11.7* 11.9*  ?HCT 35.6* 35.0*  ?MCV 103.2*  --   ?PLT 213  --   ? ? ?Basic Metabolic Panel: ?Recent Labs  ?Lab 03/16/2022 ?1221 03/03/2022 ?1226  ?NA 138 140  ?K 3.8 3.8  ?CL 108 103  ?CO2 23  --   ?GLUCOSE 110* 108*  ?BUN 16 18  ?CREATININE 0.99 1.00  ?CALCIUM 9.6  --   ? ?GFR: ?CrCl  cannot be calculated (Unknown ideal weight.). ?Recent Labs  ?Lab 03/02/2022 ?1221  ?WBC 6.5  ? ? ?Liver Function Tests: ?Recent Labs  ?Lab 03/01/2022 ?1221  ?AST 15  ?ALT 10  ?ALKPHOS 69  ?BILITOT 0.6  ?PROT 6.2*  ?ALBUMIN 3.4*  ? ?No results for input(s): LIPASE, AMYLASE in the last 168 hours. ?No results for input(s): AMMONIA in the last 168 hours. ? ?ABG ?   ?Component Value Date/Time  ? TCO2 27 03/03/2022 1226  ?  ? ?Coagulation Profile: ?Recent Labs  ?Lab 02/24/2022 ?1221  ?INR 1.1   ? ? ?Cardiac Enzymes: ?No results for input(s): CKTOTAL, CKMB, CKMBINDEX, TROPONINI in the last 168 hours. ? ?HbA1C: ?No results found for: HGBA1C ? ?CBG: ?Recent Labs  ?Lab 02/22/2022 ?1221  ?GLUCAP 120*  ? ? ?Review of Systems:   ?Unable to obtain due to clinical status  ? ?Past Medical History:  ?She,  has a past medical history of Cataract, GERD (gastroesophageal reflux disease), Hyperlipemia, Hypertension, Osteoporosis, Renal disorder, and Vitamin D deficiency disease.  ? ?Surgical History:  ? ?Past Surgical History:  ?Procedure Laterality Date  ? APPENDECTOMY    ?  ? ?Social History:  ? reports that she has never smoked. She has never used smokeless tobacco. She reports that she does not drink alcohol.  ? ?Family History:  ?Her family history includes Breast cancer (age of onset: 9) in her sister.  ? ?Allergies ?Allergies  ?Allergen Reactions  ? Aspirin Nausea And Vomiting  ?  Uncoated aspirin  ? Cortisone Hives  ?  Hives around neck.  ? Penicillins Hives  ?  Hives around ears.  ?  ? ?Home Medications  ?Prior to Admission medications   ?Medication Sig Start Date End Date Taking? Authorizing Provider  ?sertraline (ZOLOFT) 100 MG tablet Take 100 mg by mouth daily.   Yes [provider]  ?alendronate (FOSAMAX) 70 MG tablet Take 70 mg by mouth once a week. 06/11/20   [provider]  ?ALPRAZolam Duanne Moron) 0.25 MG tablet Take 0.25 mg by mouth 2 (two) times daily as needed for anxiety (anxiety).    [provider]  ?amLODipine (NORVASC) 5 MG tablet Take 5 mg by mouth 2 (two) times daily. 12/20/21   [provider]  ?aspirin 81 MG tablet Take 81 mg by mouth daily.    [provider]  ?bisoprolol-hydrochlorothiazide (ZIAC) 10-6.25 MG per tablet Take 1 tablet by mouth 2 (two) times daily.    [provider]  ?famotidine (PEPCID) 20 MG tablet Take 20 mg by mouth 2 (two) times daily. 06/02/20   [provider]  ?losartan (COZAAR) 100 MG tablet Take 100 mg by mouth  daily.    [provider]  ?simvastatin (ZOCOR) 20 MG tablet Take 20 mg by mouth daily.    [provider]  ?  ? ?Critical care time: 50 ?  ? ? ? ? ? ?

## 2022-03-04 NOTE — Progress Notes (Signed)
PHARMACIST CODE STROKE RESPONSE ? ?Notified to mix TNK at 1230 by Dr. Lorrin Goodell  ?Delivered TNK to RN at 1231 ? ?TNK dose = 13 mg IV over 5 seconds.  ? ?Issues/delays encountered (if applicable): CTA ? ?Elizabeth Brock ?03/07/2022 1:11 PM ?

## 2022-03-04 NOTE — Code Documentation (Signed)
Stroke Response Nurse Documentation ?Code Documentation ? ?Elizabeth Brock is a 88 y.o. female arriving to [facility]  via [facility] on 03/12/2022 with past medical hx of GERD, hypertension, hyperlipidemia, osteoporosis, vitamin D deficiency. On aspirin 81 mg daily. Code stroke was activated by EMS.  ? ?Patient from home where she was LKW at 1000 and now complaining of left gaze, right weakness, aphasia, facial droop. Patient was in her normal state of health at 1000. Her husband found her at home awhile later with facial droop, nonverbal, and right-sided weakness. He called 911. [facility] arrived and activated the code stroke. Pt is usually independent and can take care of herself. She lives home with husband.  ? ?Stroke team at the bedside on patient arrival. Labs drawn and patient cleared for CT by EDP. Patient to CT with team. NIHSS 24, see documentation for details and code stroke times. Patient with disoriented, not following commands, left gaze preference , right hemianopia, left facial droop, right arm weakness, right leg weakness, right decreased sensation, Global aphasia , dysarthria , and Sensory  neglect on exam. The following imaging was completed:  CT Head and CTA. Code IR activated by SRN. Patient is a candidate for IV Thrombolytic due to acute stroke symptoms with no exclusion criteria. Patient is a candidate for IR due to LVO per MD. TNK given at 1234. BP 141/74. Pt taken to IR suite and prepped for procedure. Consent given over the phone with SRN and Neurologist by pt's daughter.   ? ?Care Plan: Thrombectomy and TNK frequent monitoring, ICU admission.  ? ?Bedside handoff with IR RN Elizabeth Brock.   ? ?Elizabeth Brock, Elizabeth Brock  ?Stroke Response RN ?  ?

## 2022-03-04 NOTE — Progress Notes (Signed)
Patient's clothing, shoes, an Apple watch, and three silver rings were placed in a belongings bag with IR RN.  ? ?Elizabeth Brock, [name], RN   ?

## 2022-03-04 NOTE — Anesthesia Procedure Notes (Signed)
Procedure Name: Intubation ?Date/Time: 03/08/2022 1:55 PM ?Performed by: Lorie Phenix, CRNA ?Pre-anesthesia Checklist: Patient identified, Emergency Drugs available, Suction available and Patient being monitored ?Patient Re-evaluated:Patient Re-evaluated prior to induction ?Oxygen Delivery Method: Circle System Utilized ?Preoxygenation: Pre-oxygenation with 100% oxygen ?Induction Type: IV induction, Rapid sequence and Cricoid Pressure applied ?Laryngoscope Size: Mac and 3 ?Grade View: Grade I ?Tube type: Oral ?Tube size: 7.0 mm ?Number of attempts: 1 ?Airway Equipment and Method: Stylet ?Placement Confirmation: ETT inserted through vocal cords under direct vision, positive ETCO2 and breath sounds checked- equal and bilateral ?Secured at: 21 cm ?Tube secured with: Tape ?Dental Injury: Teeth and Oropharynx as per pre-operative assessment  ?Comments: Code Stroke - RSI performed ? ? ? ? ?

## 2022-03-04 NOTE — Progress Notes (Signed)
IR tech received phone call from PACU with urgency of patient bleeding.  NIR physician in another emergent case, IR physician in case, and all extenders gone for the day. IR MD was finished in a couple of minutes after IR tech received call. IR tech, IR MD, and IR nurse grabbed emergency equipment for bleeding and went to PACU. When IR staff arrived to PACU we immediately assessed patient's groin site. Site was soft, not actively bleeding and previous bleeding marked on same original dressing. PACU RN refused to look at IR staff and kept back to [place] but stated that she held pressure and bleeding has stopped. Checked pulse (DP) continued to be palpable. Nothing further needed at this time.  ?

## 2022-03-04 NOTE — ED Provider Notes (Signed)
?[facility] ?Provider Note ? ? ?CSN: [id number] ?Arrival date & time: 02/20/2022  1218 ? ?An emergency department physician performed an initial assessment on this suspected stroke patient at 1223. ? ?History ? ?Chief Complaint  ?Patient presents with  ? Code Stroke  ? ? ?Elizabeth Brock is a 88 y.o. female. ? ?HPI ? ?  ? ?88yo female with history of hypertension, hyperlipidemia, GERD who presented as a Code Stroke with right sided weakness, aphasia, left gaze preference. ? ? ?History limited by acuity of condition and patient condition, nonverbal at this time.  ? ?Per report she is normally able to walk independently, uses ipad, reads. She was LNW at [place] today, he went out to run errands and found her on the floor not responding.  EMS found her unable to move the right side and called Code Stroke.   ? ? ?Past Medical History:  ?Diagnosis Date  ? Cataract   ? GERD (gastroesophageal reflux disease)   ? Hyperlipemia   ? Hypertension   ? Osteoporosis   ? Renal disorder   ? Vitamin D deficiency disease   ?  ? ?Home Medications ?Prior to Admission medications   ?Medication Sig Start Date End Date Taking? Authorizing Provider  ?sertraline (ZOLOFT) 100 MG tablet Take 100 mg by mouth daily.   Yes [provider]  ?alendronate (FOSAMAX) 70 MG tablet Take 70 mg by mouth once a week. 06/11/20   [provider]  ?ALPRAZolam Duanne Moron) 0.25 MG tablet Take 0.25 mg by mouth 2 (two) times daily as needed for anxiety (anxiety).    [provider]  ?amLODipine (NORVASC) 5 MG tablet Take 5 mg by mouth 2 (two) times daily. 12/20/21   [provider]  ?aspirin 81 MG tablet Take 81 mg by mouth daily.    [provider]  ?bisoprolol-hydrochlorothiazide (ZIAC) 10-6.25 MG per tablet Take 1 tablet by mouth 2 (two) times daily.    [provider]  ?famotidine (PEPCID) 20 MG tablet Take 20 mg by mouth 2 (two) times daily. 06/02/20   [provider]   ?losartan (COZAAR) 100 MG tablet Take 100 mg by mouth daily.    [provider]  ?simvastatin (ZOCOR) 20 MG tablet Take 20 mg by mouth daily.    [provider]  ?   ? ?Allergies    ?Aspirin, Cortisone, and Penicillins   ? ?Review of Systems   ?Review of Systems ? ?Physical Exam ?Updated Vital Signs ?BP 114/67   Pulse (!) 58   Temp (!) 97 ?F (36.1 ?C)   Resp 15   Ht 5' (1.524 m)   Wt 53.2 kg   SpO2 100%   BMI 22.91 kg/m?  ?Physical Exam ?Vitals and nursing note reviewed.  ?Constitutional:   ?   General: She is not in acute distress. ?   Appearance: She is well-developed. She is not diaphoretic.  ?HENT:  ?   Head: Normocephalic and atraumatic.  ?Eyes:  ?   Conjunctiva/sclera: Conjunctivae normal.  ?Cardiovascular:  ?   Rate and Rhythm: Normal rate and regular rhythm.  ?   Heart sounds: Normal heart sounds. No murmur heard. ?  No friction rub. No gallop.  ?Pulmonary:  ?   Effort: Pulmonary effort is normal. No respiratory distress.  ?   Breath sounds: Normal breath sounds. No wheezing or rales.  ?Abdominal:  ?   General: There is no distension.  ?   Palpations: Abdomen is soft.  ?  Tenderness: There is no abdominal tenderness. There is no guarding.  ?Musculoskeletal:     ?   General: No tenderness.  ?   Cervical back: Normal range of motion.  ?Skin: ?   General: Skin is warm and dry.  ?   Findings: No erythema or rash.  ?Neurological:  ?   Mental Status: She is alert.  ?   Motor: Weakness (right sided weakness) present.  ?   Comments: Eyes open, dense aphasia, not answering any questions, does not seem to understand verbal commands but is able to mimick movements, hold left arm and leg up ?Right sided weakness, right facial droop, left gaze preference  ? ? ?ED Results / Procedures / Treatments   ?Labs ?(all labs ordered are listed, but only abnormal results are displayed) ?Labs Reviewed  ?CBC - Abnormal; Notable for the following components:  ?    Result Value  ? RBC 3.45 (*)   ? Hemoglobin  11.7 (*)   ? HCT 35.6 (*)   ? MCV 103.2 (*)   ? All other components within normal limits  ?COMPREHENSIVE METABOLIC PANEL - Abnormal; Notable for the following components:  ? Glucose, Bld 110 (*)   ? Total Protein 6.2 (*)   ? Albumin 3.4 (*)   ? GFR, Estimated 56 (*)   ? All other components within normal limits  ?HEMOGLOBIN A1C - Abnormal; Notable for the following components:  ? Hgb A1c MFr Bld 5.7 (*)   ? All other components within normal limits  ?CBC - Abnormal; Notable for the following components:  ? RBC 3.37 (*)   ? Hemoglobin 11.4 (*)   ? HCT 34.0 (*)   ? MCV 100.9 (*)   ? All other components within normal limits  ?I-STAT CHEM 8, ED - Abnormal; Notable for the following components:  ? Glucose, Bld 108 (*)   ? Hemoglobin 11.9 (*)   ? HCT 35.0 (*)   ? All other components within normal limits  ?CBG MONITORING, ED - Abnormal; Notable for the following components:  ? Glucose-Capillary 120 (*)   ? All other components within normal limits  ?POCT I-STAT 7, (LYTES, BLD GAS, ICA,H+H) - Abnormal; Notable for the following components:  ? pO2, Arterial 147 (*)   ? Potassium 3.4 (*)   ? HCT 32.0 (*)   ? Hemoglobin 10.9 (*)   ? All other components within normal limits  ?RESP PANEL BY RT-PCR (FLU A&B, COVID) [id number]  ?MRSA NEXT GEN BY PCR, NASAL  ?ETHANOL  ?PROTIME-INR  ?APTT  ?DIFFERENTIAL  ?RAPID URINE DRUG SCREEN, HOSP PERFORMED  ?URINALYSIS, ROUTINE W REFLEX MICROSCOPIC  ?LIPID PANEL  ?BLOOD GAS, ARTERIAL  ?CBC WITH DIFFERENTIAL/PLATELET  ?BASIC METABOLIC PANEL  ?TRIGLYCERIDES  ? ? ?EKG ?None ? ?Radiology ?DG CHEST PORT 1 VIEW ? ?Result Date: 03/02/2022 ?CLINICAL DATA:  Enteric tube placement. EXAM: PORTABLE CHEST 1 VIEW COMPARISON:  Chest x-ray from same day at 1827 hours. FINDINGS: Enteric tube tip in the stomach. Stable cardiomediastinal silhouette. Minimal bibasilar atelectasis/scarring. Visualized bowel gas pattern is nonobstructive. IMPRESSION: 1. Enteric tube in the stomach. Electronically Signed   By: Titus Dubin M.D.   On: 03/12/2022 21:05  ? ?DG CHEST PORT 1 VIEW ? ?Result Date: 03/06/2022 ?CLINICAL DATA:  Status post intubation. EXAM: PORTABLE CHEST 1 VIEW COMPARISON:  November 01, 2020 FINDINGS: Endotracheal tube is seen with limited visualization of its distal tip secondary to the presence of an overlying nasogastric tube. The distal tip appears to sit  proximally 3.2 cm from the carina. The distal tip of the nasogastric tube extends into the body of the stomach. Diffuse, chronic appearing increased lung markings are seen with mild, stable bibasilar atelectasis. There is no evidence of a pleural effusion or pneumothorax. The heart size and mediastinal contours are within normal limits. There is marked severity calcification of the aortic arch. The visualized skeletal structures are unremarkable. IMPRESSION: Endotracheal tube and nasogastric tube positioning, as described above. Electronically Signed   By: Virgina Norfolk M.D.   On: 03/14/2022 19:05  ? ?CT HEAD CODE STROKE WO CONTRAST ? ?Result Date: 02/24/2022 ?CLINICAL DATA:  Code stroke EXAM: CT ANGIOGRAPHY HEAD AND NECK TECHNIQUE: Multidetector CT imaging of the head and neck was performed using the standard protocol during bolus administration of intravenous contrast. Multiplanar CT image reconstructions and MIPs were obtained to evaluate the vascular anatomy. Carotid stenosis measurements (when applicable) are obtained utilizing NASCET criteria, using the distal internal carotid diameter as the denominator. RADIATION DOSE REDUCTION: This exam was performed according to the departmental dose-optimization program which includes automated exposure control, adjustment of the mA and/or kV according to patient size and/or use of iterative reconstruction technique. CONTRAST:  20mL OMNIPAQUE IOHEXOL 350 MG/ML SOLN COMPARISON:  CT head 11/01/2020 FINDINGS: Brain: There is no evidence of acute intracranial hemorrhage, extra-axial fluid collection, or acute infarct.  There is mild global parenchymal volume loss with prominence of the ventricular system and extra-axial CSF spaces. The ventricles are stable in size. Extensive hypodensity throughout the subcortical and perive

## 2022-03-04 NOTE — Anesthesia Procedure Notes (Signed)
Arterial Line Insertion ?Start/End04/03/2022 3:55 PM, 03/04/2022 3:58 PM ?Performed by: Zollie Beckers, CRNA, CRNA ? Patient location: OR. ?Preanesthetic checklist: patient identified, IV checked and monitors and equipment checked ?Emergency situation ?Left, radial was placed ?Catheter size: 20 G ?Hand hygiene performed , maximum sterile barriers used  and Seldinger technique used ?Allen's test indicative of satisfactory collateral circulation ?Attempts: 1 ?Procedure performed without using ultrasound guided technique. ?Following insertion, dressing applied and Biopatch. ?Post procedure assessment: normal and unchanged ? ? ? ?

## 2022-03-04 NOTE — Consult Note (Incomplete)
NEUROLOGY CONSULTATION NOTE  ? ?Date of service: March 04, 2022 ?Patient Name: Elizabeth Brock ?MRN:  [id number] ?DOB:  [birth year] ?Reason for consult: "***" ?Requesting Provider: Gareth Morgan, MD ? ?History of Present Illness  ?REASE BAUDOIN is a 88 y.o. female ? ?LKW: *** ?mRS: *** ?tNKASE: *** ?Thrombectomy: *** ?NIHSS components Score: Comment  ?1a Level of Conscious 0[]  1[]  2[]  3[]      ?1b LOC Questions 0[]  1[]  2[]       ?1c LOC Commands 0[]  1[]  2[]       ?2 Best Gaze 0[]  1[]  2[]       ?3 Visual 0[]  1[]  2[]  3[]      ?4 Facial Palsy 0[]  1[]  2[]  3[]      ?5a Motor Arm - left 0[]  1[]  2[]  3[]  4[]  UN[]    ?5b Motor Arm - Right 0[]  1[]  2[]  3[]  4[]  UN[]    ?6a Motor Leg - Left 0[]  1[]  2[]  3[]  4[]  UN[]    ?6b Motor Leg - Right 0[]  1[]  2[]  3[]  4[]  UN[]    ?7 Limb Ataxia 0[]  1[]  2[]  3[]  UN[]     ?8 Sensory 0[]  1[]  2[]  UN[]      ?9 Best Language 0[]  1[]  2[]  3[]      ?10 Dysarthria 0[]  1[]  2[]  UN[]      ?11 Extinct. and Inattention 0[]  1[]  2[]       ?TOTAL:    ?  ? ?ROS  ? ?Constitutional Denies weight loss, fever and chills. ***  ?HEENT Denies changes in vision and hearing. ***  ?Respiratory Denies SOB and cough. ***  ?CV Denies palpitations and CP ***  ?GI Denies abdominal pain, nausea, vomiting and diarrhea. ***  ?GU Denies dysuria and urinary frequency. ***  ?MSK Denies myalgia and joint pain. ***  ?Skin Denies rash and pruritus. ***  ?Neurological Denies headache and syncope. ***  ?Psychiatric Denies recent changes in mood. Denies anxiety and depression. ***  ? ?Past History  ? ?Past Medical History:  ?Diagnosis Date  ? Cataract   ? GERD (gastroesophageal reflux disease)   ? Hyperlipemia   ? Hypertension   ? Osteoporosis   ? Renal disorder   ? Vitamin D deficiency disease   ? ?Past Surgical History:  ?Procedure Laterality Date  ? APPENDECTOMY    ? ?Family History  ?Problem Relation Age of Onset  ? Breast cancer Sister 33  ?     Twin Sister  ? ?Social History  ? ?Socioeconomic History  ? Marital status: Married  ?  Spouse  name: Not on file  ? Number of children: Not on file  ? Years of education: Not on file  ? Highest education level: Not on file  ?Occupational History  ? Not on file  ?Tobacco Use  ? Smoking status: Never  ? Smokeless tobacco: Never  ?Substance and Sexual Activity  ? Alcohol use: No  ? Drug use: Not on file  ? Sexual activity: Not on file  ?Other Topics Concern  ? Not on file  ?Social History Narrative  ? Tobacco use cigarettes: Never smoked last updated 09/20/2013  ? No smoking   ? No tobacco exposure  ? No alcohol  ? Caffeine: yes   ? No recreational drug use  ? No diet  ? No exercise  ? Occupation :retired  ? Martial status: married  ? Children: four  ? ?Social Determinants of Health  ? ?Financial Resource Strain: Not on file  ?Food Insecurity: Not on file  ?Transportation Needs: Not on file  ?Physical Activity: Not on  file  ?Stress: Not on file  ?Social Connections: Not on file  ? ?Allergies  ?Allergen Reactions  ? Cortisone Hives  ?  Hives around neck.  ? Penicillins Hives  ?  Hives around ears.  ? ? ?Medications  ?(Not in a hospital admission) ?  ? ?Vitals  ? ?Vitals:  ? 02/28/2022 1200  ?Weight: 53.2 kg  ?  ? ?There is no height or weight on file to calculate BMI. ? ?Physical Exam  ?General: Laying comfortably in bed; in no acute distress. *** ?Pulmonary: Symmetric chest rise. Non-labored respiratory effort. *** ?Ext: No cyanosis, edema, or deformity *** ?Musculoskeletal: Normal digits and nails by inspection. No clubbing. *** ? ?Neurologic Examination  ?Mental status/Cognition:  ?Alert. Oriented to self, place, month and year ?Good attention. *** ?Speech/language:  ?Fluent, thought content appropriate.  ?Comprehension intact-able to follow 3 step commands without difficulty. ***,  ?Object naming intact, repetition intact.    ? ?Cranial Nerves: ?II: no VF deficits. blinks to threat OU medially and laterally. discs flat bilaterally*** ?III,IV, VI: ptosis not present, extra-ocular motions intact bilaterally.  pupils equal, round, reactive to light and accommodation, no gaze preference or deviation. no nystagmus *** ?V: facial light touch sensation normal bilaterally ?VII: smile symmetric, no asymmetry, no nasolabial fold flattening *** ?VIII: hearing normal bilaterally ?IX,X: cough and gag intact. uvula rises symmetrically.  ?XI: bilateral shoulder shrug and head turn ?XII: midline tongue extension ?Motor: ?R  UE ***/5 LE ***/5  ?L UE ***/5 LE ***/5  ?normal tone throughout. normal bulk throughout. no atrophy noted ? ?Sensory: Pinprick and light touch intact throughout bilaterally ? ?Deep Tendon Reflexes: 2+ and symmetric throughout ?Plantars: downgoing bilaterally *** ? ?Coordination/Complex Motor:  ?- Finger to Nose intact*** ?- Rapid alternating movement intact*** ?- Heel to shin intact*** ?- Gait: deferred ?Stride length, Arm swing,  Base width *** ? ?Reference only*** ? ?Mvmt Root Nerve  Muscle Right Left Comments  ?SA C5/6 Ax Deltoid     ?EF C5/6 Mc Biceps     ?EE C6/7/8 Rad Triceps     ?WF C6/7 Med FCR     ?WE C7/8 PIN ECU     ?F Ab C8/T1 U ADM/FDI     ?HF L1/2/3 Fem Illopsoas     ?KE L2/3/4 Fem Quad     ?DF L4/5 D Peron Tib Ant     ?PF S1/2 Tibial Grc/Sol     ? ?Reflexes: ? Right Left Comments  ?Pectoralis     ? Biceps (C5/6)     ?Brachioradialis (C5/6)     ? Triceps (C6/7)     ? Patellar (L3/4)     ? Achilles (S1)     ? Hoffman     ? Plantar     ?Jaw jerk   ? ?Sensation: ? Light touch   ? Pin prick   ? Temperature   ? Vibration   ?Proprioception   ? ?Labs  ? ?CBC:  ?Recent Labs  ?Lab 03/12/2022 ?1226  ?HGB 11.9*  ?HCT 35.0*  ? ? ?Basic Metabolic Panel:  ?Lab Results  ?Component Value Date  ? NA 140 03/06/2022  ? K 3.8 03/09/2022  ? CO2 24 09/19/2014  ? GLUCOSE 108 (H) 02/21/2022  ? BUN 18 02/27/2022  ? CREATININE 1.00 03/10/2022  ? CALCIUM 9.2 09/19/2014  ? GFRNONAA 28 (L) 09/19/2014  ? GFRAA 33 (L) 09/19/2014  ? ?Lipid Panel: No results found for: [place] ?HgbA1c: No results found for: HGBA1C ?Urine Drug Screen:  No results  found for: LABOPIA, COCAINSCRNUR, [place], [place], THCU, LABBARB  ?Alcohol Level No results found for: ETH ?No results found. ?CT Head without contrast(Personally reviewed): ?*** ? ?CT angio Head and Neck with contrast(Personally reviewed): ?*** ? ?MR Angio head without contrast and Carotid Duplex BL(Personally reviewed): ?*** ? ?MRI Brain(Personally reviewed): ?*** ?No results found.  ? ?rEEG:  ?*** ?Impression  ?Haillie Premo is a 88 y.o. female  ? ? ?Recommendations  ? ? ? ? ?Signed: ?Merrily Brittle, DO ?Resident, PGY-1 ?[facility] ?03/01/2022, 12:42 PM   ?

## 2022-03-04 NOTE — ED Triage Notes (Signed)
Pt BIB [facility] as a code stroke from home . LSN 9am this morning. Per EMS, pt was sitting on the chair. Pt's husbanded noticed pt was exhibiting R facial droop, R side weakness, and aphasia. Pt was non-verbal w EMS. Pt's baseline is A&O X4 . Ambulatory.  ?

## 2022-03-04 NOTE — Procedures (Addendum)
INR. ?Left common carotid arteriogram. ?Right CFA approach. ?Findings. 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 ?Status post endovascular revascularization of occluded left middle cerebral artery with 2 passes with a 5 mm x 37 mm embotrap retrieval device and contact aspiration, and 1 pass with a 4 mm x 40 mm solitary retrieval device and contact aspiration followed by rescue stenting using an Onyx 2.25 mm x 22 mm balloon mounted stent achieving aTICI 2B revascularization. ?Post CT of the brain demonstrates contrast stain in the left caudate head, in the left putamen. ? ?8 French Angio-Seal closure device used for right groin hemostasis with manual compression.  Distal pulses all dopplerable unchanged.  Patient left intubated to protect airway because of her medical condition. ?Medications.  Aspirin 81 mg and Brilinta 180  mg via orogastric tube.  Integrilin 7.5 mg intra-arterially after placement of the stent ?Pupils 3 mm equal sluggish to react. ?Fatima Sanger MD ?

## 2022-03-05 ENCOUNTER — Inpatient Hospital Stay (HOSPITAL_COMMUNITY): Payer: Medicare Other

## 2022-03-05 DIAGNOSIS — Z9911 Dependence on respirator [ventilator] status: Secondary | ICD-10-CM

## 2022-03-05 DIAGNOSIS — I63512 Cerebral infarction due to unspecified occlusion or stenosis of left middle cerebral artery: Secondary | ICD-10-CM | POA: Diagnosis not present

## 2022-03-05 DIAGNOSIS — I10 Essential (primary) hypertension: Secondary | ICD-10-CM | POA: Diagnosis not present

## 2022-03-05 DIAGNOSIS — M7989 Other specified soft tissue disorders: Secondary | ICD-10-CM | POA: Diagnosis not present

## 2022-03-05 DIAGNOSIS — I6602 Occlusion and stenosis of left middle cerebral artery: Secondary | ICD-10-CM

## 2022-03-05 DIAGNOSIS — R1312 Dysphagia, oropharyngeal phase: Secondary | ICD-10-CM

## 2022-03-05 DIAGNOSIS — E7800 Pure hypercholesterolemia, unspecified: Secondary | ICD-10-CM

## 2022-03-05 LAB — URINALYSIS, ROUTINE W REFLEX MICROSCOPIC
Bilirubin Urine: NEGATIVE
Glucose, UA: NEGATIVE mg/dL
Hgb urine dipstick: NEGATIVE
Ketones, ur: NEGATIVE mg/dL
Leukocytes,Ua: NEGATIVE
Nitrite: NEGATIVE
Protein, ur: NEGATIVE mg/dL
Specific Gravity, Urine: 1.044 — ABNORMAL HIGH (ref 1.005–1.030)
pH: 5 (ref 5.0–8.0)

## 2022-03-05 LAB — LIPID PANEL
Cholesterol: 125 mg/dL (ref 0–200)
HDL: 45 mg/dL (ref >40)
LDL Cholesterol: 60 mg/dL (ref 0–99)
Total CHOL/HDL Ratio: 2.8 RATIO
Triglycerides: 101 mg/dL (ref <150)
VLDL: 20 mg/dL (ref 0–40)

## 2022-03-05 LAB — CBC WITH DIFFERENTIAL/PLATELET
Abs Immature Granulocytes: 0.06 10*3/uL (ref 0.00–0.07)
Basophils Absolute: 0 10*3/uL (ref 0.0–0.1)
Basophils Relative: 0 %
Eosinophils Absolute: 0 10*3/uL (ref 0.0–0.5)
Eosinophils Relative: 0 %
HCT: 30 % — ABNORMAL LOW (ref 36.0–46.0)
Hemoglobin: 10.4 g/dL — ABNORMAL LOW (ref 12.0–15.0)
Immature Granulocytes: 1 %
Lymphocytes Relative: 14 %
Lymphs Abs: 1 10*3/uL (ref 0.7–4.0)
MCH: 34.6 pg — ABNORMAL HIGH (ref 26.0–34.0)
MCHC: 34.7 g/dL (ref 30.0–36.0)
MCV: 99.7 fL (ref 80.0–100.0)
Monocytes Absolute: 0.3 10*3/uL (ref 0.1–1.0)
Monocytes Relative: 4 %
Neutro Abs: 6 10*3/uL (ref 1.7–7.7)
Neutrophils Relative %: 81 %
Platelets: 235 10*3/uL (ref 150–400)
RBC: 3.01 MIL/uL — ABNORMAL LOW (ref 3.87–5.11)
RDW: 13.6 % (ref 11.5–15.5)
WBC: 7.4 10*3/uL (ref 4.0–10.5)
nRBC: 0 % (ref 0.0–0.2)

## 2022-03-05 LAB — RAPID URINE DRUG SCREEN, HOSP PERFORMED
Amphetamines: NOT DETECTED
Barbiturates: NOT DETECTED
Benzodiazepines: NOT DETECTED
Cocaine: NOT DETECTED
Opiates: NOT DETECTED
Tetrahydrocannabinol: NOT DETECTED

## 2022-03-05 LAB — ECHOCARDIOGRAM COMPLETE
AR max vel: 2.29 cm2
AV Area VTI: 2.28 cm2
AV Area mean vel: 2.44 cm2
AV Mean grad: 6.4 mmHg
AV Peak grad: 12.8 mmHg
Ao pk vel: 1.79 m/s
Area-P 1/2: 4.17 cm2
Calc EF: 65.9 %
Height: 60 in
S' Lateral: 2.8 cm
Single Plane A2C EF: 62.9 %
Single Plane A4C EF: 65.9 %
Weight: 1876.56 oz

## 2022-03-05 LAB — BASIC METABOLIC PANEL
Anion gap: 6 (ref 5–15)
BUN: 13 mg/dL (ref 8–23)
CO2: 21 mmol/L — ABNORMAL LOW (ref 22–32)
Calcium: 8.8 mg/dL — ABNORMAL LOW (ref 8.9–10.3)
Chloride: 113 mmol/L — ABNORMAL HIGH (ref 98–111)
Creatinine, Ser: 0.86 mg/dL (ref 0.44–1.00)
GFR, Estimated: >60 mL/min (ref >60)
Glucose, Bld: 155 mg/dL — ABNORMAL HIGH (ref 70–99)
Potassium: 3.7 mmol/L (ref 3.5–5.1)
Sodium: 140 mmol/L (ref 135–145)

## 2022-03-05 MED ORDER — ENOXAPARIN SODIUM 30 MG/0.3ML IJ SOSY
30.0000 mg | PREFILLED_SYRINGE | INTRAMUSCULAR | Status: DC
  Administered 2022-03-05 – 2022-03-08 (×3): 30 mg via SUBCUTANEOUS
  Filled 2022-03-05 (×3): qty 0.3

## 2022-03-05 NOTE — Progress Notes (Signed)
Patient transported on vent to MRI and returned to 4N27 without complications. ?During transport and the MRI the patient was on full vent support. Patient placed back on wean mode upon return to the ICU.  RN and family at bedside. ?

## 2022-03-05 NOTE — Progress Notes (Signed)
Right lower extremity venous duplex has been completed. ?Preliminary results can be found in CV Proc through chart review.  ? ?03/05/22 2:37 PM ?Olen Cordial RVT   ?

## 2022-03-05 NOTE — Procedures (Signed)
Extubation Procedure Note ? ?Patient Details:   ?Name: Elizabeth Brock ?DOB: 1933/04/24 ?MRN: [id number] ?  ?Airway Documentation:  ?  ?Vent end date: 03/05/22 Vent end time: 1600  ? ?Evaluation ? O2 sats: stable throughout ?Complications: No apparent complications ?Patient did tolerate procedure well. ?Bilateral Breath Sounds: Clear, Diminished ?  ?No ? ?Positive cuff leak noted. Patient placed on [place] 3L with humidity, no stridor noted. Patient is not speaking. Patient not following commands at this time. IS at bedside. ? ?Rayburn Felt ?03/05/2022, 4:04 PM ? ?

## 2022-03-05 NOTE — Progress Notes (Signed)
OT Cancellation Note ? ?Patient Details ?Name: Elizabeth Brock ?MRN: [id number] ?DOB: 1933/01/15 ? ? ?Cancelled Treatment:    Reason Eval/Treat Not Completed: Active bedrest order.  OT to continue efforts as appropriate.   ? ?Latravious Levitt D Adhya Cocco ?03/05/2022, 1:15 PM ?

## 2022-03-05 NOTE — Progress Notes (Signed)
SLP Cancellation Note ? ?Patient Details ?Name: Elizabeth Brock ?MRN: [id number] ?DOB: Apr 11, 1933 ? ? ?Cancelled treatment:       Reason Eval/Treat Not Completed: Other (Pt on ventilator. Will f/u for completion of speech-language eval when pt is medically ready). ? ? ? ?Avie Echevaria, MA, CCC-SLP ?Acute Rehabilitation Services ?Office Number: [phone] ? ?Paulette Blanch ?03/05/2022, 8:30 AM ?

## 2022-03-05 NOTE — Progress Notes (Signed)
? ?NAME:  Elizabeth Brock, MRN:  [id number], DOB:  Mar 13, 1933, LOS: 1 ?ADMISSION DATE:  03/02/2022, CONSULTATION DATE:  03/07/2022 ?REFERRING MD:  Dr Alferd Patee, CHIEF COMPLAINT:  Stroke ? ?History of Present Illness:  ?88 yo female admitted with L MCA stroke, s/p TNK and thrombectomy ? ?Patient presented to the ED with complaints of right sided weakness associated with left gaze preference and right hemianopsia.  NIHSS: 24.  Stroke protocol initiated, neurology consulted, patient was found to have L M1 occlusion.  Patient received TNK and then proceeded with thrombectomy with a total of 3 passes. TICI 2B. Post procedure CTH with contrast staining.  Right femoral site closed with angio-seal  ? ?Patient evaluated in PACU. Intubated and sedated, on propofol infusion.  PERRL, + cough/gag.  Not following commands. Bilateral DP palpable and warm. No bleeding at femoral site.   ? ?Pertinent  Medical History  ?GERD ?Hypertension ?Hyperlipidemia ?Osteoporosis ?Vitamin D Deficiency ? ?Significant Hospital Events: ?Including procedures, antibiotic start and stop dates in addition to other pertinent events   ?4/14: TNK and thrombectomy. Intubated ?4/15 Waking up, tolerating SBT ? ?Interim History / Subjective:  ?No acute events overnight, remains intubated, tolerating PSV ? ? ?Objective   ?Blood pressure (!) 165/62, pulse 61, temperature 97.9 ?F (36.6 ?C), temperature source Axillary, resp. rate 17, height 5' (1.524 m), weight 53.2 kg, SpO2 100 %. ?   ?Vent Mode: PSV;CPAP ?FiO2 (%):  [40 %] 40 % ?Set Rate:  [15 bmp] 15 bmp ?Vt Set:  [360 mL] 360 mL ?PEEP:  [5 cmH20] 5 cmH20 ?Pressure Support:  [5 cmH20] 5 cmH20 ?Plateau Pressure:  [16 cmH20-18 cmH20] 18 cmH20  ? ?Intake/Output Summary (Last 24 hours) at 03/05/2022 0815 ?Last data filed at 03/05/2022 0600 ?Gross per 24 hour  ?Intake 2340.35 ml  ?Output 1900 ml  ?Net 440.35 ml  ? ? ?Filed Weights  ? 03/02/2022 1200  ?Weight: 53.2 kg  ? ? ? ?General:  critically ill elderly F ventilated  and sedated ?HEENT: MM pink/moist, sclera anicteric, pupils equal, ETT in place ?Neuro: opens eyes to voice and moving L side to command, no movement on the R ?CV: s1s2 rrr, no m/r/g ?PULM:  mechanical breath sounds bilaterally, no rhonchi or wheezing, good TV on PSV 5/5 40% ?GI: soft, bsx4 active  ?Extremities: warm/dry, no edema  ?Skin: no rashes or lesions ? ? ? ? ? ?Resolved Hospital Problem list   ? ? ?Assessment & Plan:  ?Acute left MCA stroke s/p TNK and thrombectomy of left M1 occlusion ?Post TNK care: no antiplt/anticoag x 24 hours. 24 hour imaging post TNK administration, sooner if neurological change ?-Neurology following, MRI pending  ?-A1c and Lipid panel fairly unremarkable ?-echo pending ?-Asa and Brilinta initiated ?-no bleeding from femoral site ?-Statin resumed ?-Goal SBP < 180 and DBP < 110 ? ? ? ? ?Expected post-procedure ventilator support ?Tolerating SBT/SAT ?-plan to get MRI this morning and then work towards extubation ?-Flu/COVID negative ?--Maintain full vent support with SAT/SBT as tolerated ?-titrate Vent setting to maintain SpO2 greater than or equal to 90%. ?-HOB elevated 30 degrees. ?-Plateau pressures less than 30 cm H20.  ?-Follow chest x-ray, ABG prn.   ?-Bronchial hygiene and RT/bronchodilator protocol. ? ? ?Hypertension ?Goal SBP < 180 ?-remains on Cleviprex ?-once extubated and passes swallow resume home  Norvasc 5 mg BID, Cozaar 100 mg daily, Bisopropolol-HCTZ 10-6.25 BID ? ? ?Hyperlipidemia ?-continue Home statin  ? ?GERD ?-PPI ? ?Anxiety ?Home meds: Ativan 0.25 mg BID PRN, zoloft  100 mg daily, consider resuming once extubated ? ?Best Practice (right click and "Reselect all SmartList Selections" daily)  ? ?Diet/type: NPO ?DVT prophylaxis: other ?GI prophylaxis: PPI ?Lines: N/A ?Foley:  Yes, and it is still needed ?Code Status:  full code ?Last date of multidisciplinary goals of care discussion [per primary team] ? ?Labs   ?CBC: ?Recent Labs  ?Lab 03/12/2022 ?1221 02/19/2022 ?1226  02/20/2022 ?1951 02/24/2022 ?2013 03/05/22 ?0503  ?WBC 6.5  --  7.6  --  7.4  ?NEUTROABS 3.8  --   --   --  6.0  ?HGB 11.7* 11.9* 11.4* 10.9* 10.4*  ?HCT 35.6* 35.0* 34.0* 32.0* 30.0*  ?MCV 103.2*  --  100.9*  --  99.7  ?PLT 213  --  213  --  235  ? ? ? ?Basic Metabolic Panel: ?Recent Labs  ?Lab 02/24/2022 ?1221 02/26/2022 ?1226 03/20/2022 ?2013 03/05/22 ?0503  ?NA 138 140 140 140  ?K 3.8 3.8 3.4* 3.7  ?CL 108 103  --  113*  ?CO2 23  --   --  21*  ?GLUCOSE 110* 108*  --  155*  ?BUN 16 18  --  13  ?CREATININE 0.99 1.00  --  0.86  ?CALCIUM 9.6  --   --  8.8*  ? ? ?GFR: ?Estimated Creatinine Clearance: 33.7 mL/min (by C-G formula based on SCr of 0.86 mg/dL). ?Recent Labs  ?Lab 03/08/2022 ?1221 03/20/2022 ?1951 03/05/22 ?0503  ?WBC 6.5 7.6 7.4  ? ? ? ?Liver Function Tests: ?Recent Labs  ?Lab 02/25/2022 ?1221  ?AST 15  ?ALT 10  ?ALKPHOS 69  ?BILITOT 0.6  ?PROT 6.2*  ?ALBUMIN 3.4*  ? ? ?No results for input(s): LIPASE, AMYLASE in the last 168 hours. ?No results for input(s): AMMONIA in the last 168 hours. ? ?ABG ?   ?Component Value Date/Time  ? PHART 7.417 02/27/2022 2013  ? PCO2ART 34.5 03/18/2022 2013  ? PO2ART 147 (H) 03/15/2022 2013  ? HCO3 22.3 03/17/2022 2013  ? TCO2 23 03/18/2022 2013  ? ACIDBASEDEF 2.0 03/20/2022 2013  ? O2SAT 99 03/16/2022 2013  ? ?  ? ?Coagulation Profile: ?Recent Labs  ?Lab 02/23/2022 ?1221  ?INR 1.1  ? ? ? ?Cardiac Enzymes: ?No results for input(s): CKTOTAL, CKMB, CKMBINDEX, TROPONINI in the last 168 hours. ? ?HbA1C: ?Hgb A1c MFr Bld  ?Date/Time Value Ref Range Status  ?03/18/2022 07:51 PM 5.7 (H) 4.8 - 5.6 % Final  ?  Comment:  ?  (NOTE) ?Pre diabetes:          5.7%-6.4% ? ?Diabetes:              >6.4% ? ?Glycemic control for   <7.0% ?adults with diabetes ?  ? ? ?CBG: ?Recent Labs  ?Lab 03/14/2022 ?1221  ?GLUCAP 120*  ? ? ? ?Review of Systems:   ?Unable to obtain due to clinical status  ? ?Past Medical History:  ?She,  has a past medical history of Cataract, GERD (gastroesophageal reflux disease), Hyperlipemia,  Hypertension, Osteoporosis, Renal disorder, and Vitamin D deficiency disease.  ? ?Surgical History:  ? ?Past Surgical History:  ?Procedure Laterality Date  ? APPENDECTOMY    ?  ? ?Social History:  ? reports that she has never smoked. She has never used smokeless tobacco. She reports that she does not drink alcohol.  ? ?Family History:  ?Her family history includes Breast cancer (age of onset: 52) in her sister.  ? ?Allergies ?Allergies  ?Allergen Reactions  ? Aspirin Nausea And Vomiting  ?  Uncoated aspirin  ? Cortisone Hives  ?  Hives around neck.  ? Penicillins Hives  ?  Hives around ears.  ?  ? ?Home Medications  ?Prior to Admission medications   ?Medication Sig Start Date End Date Taking? Authorizing Provider  ?sertraline (ZOLOFT) 100 MG tablet Take 100 mg by mouth daily.   Yes [provider]  ?alendronate (FOSAMAX) 70 MG tablet Take 70 mg by mouth once a week. 06/11/20   [provider]  ?ALPRAZolam Duanne Moron) 0.25 MG tablet Take 0.25 mg by mouth 2 (two) times daily as needed for anxiety (anxiety).    [provider]  ?amLODipine (NORVASC) 5 MG tablet Take 5 mg by mouth 2 (two) times daily. 12/20/21   [provider]  ?aspirin 81 MG tablet Take 81 mg by mouth daily.    [provider]  ?bisoprolol-hydrochlorothiazide (ZIAC) 10-6.25 MG per tablet Take 1 tablet by mouth 2 (two) times daily.    [provider]  ?famotidine (PEPCID) 20 MG tablet Take 20 mg by mouth 2 (two) times daily. 06/02/20   [provider]  ?losartan (COZAAR) 100 MG tablet Take 100 mg by mouth daily.    [provider]  ?simvastatin (ZOCOR) 20 MG tablet Take 20 mg by mouth daily.    [provider]  ?  ? ?Critical care time: 35 minutes ?  ? ? ? ?CRITICAL CARE ?Performed by: Otilio Carpen Deby Adger ? ? ?Total critical care time: 35 minutes ? ?Critical care time was exclusive of separately billable procedures and treating other patients. ? ?Critical care was necessary to treat  or prevent imminent or life-threatening deterioration. ? ?Critical care was time spent personally by me on the following activities: development of treatment plan with patient and/or surrogate as well a

## 2022-03-05 NOTE — Progress Notes (Signed)
All belongings taken home by family- three rings, apple watch, and clothing.  ?

## 2022-03-05 NOTE — Progress Notes (Signed)
PT Cancellation Note ? ?Patient Details ?Name: Elizabeth Brock ?MRN: [id number] ?DOB: 1933/06/02 ? ? ?Cancelled Treatment:    Reason Eval/Treat Not Completed: Active bedrest order (post revascularization). Will follow as activity orders advanced.  ? ?Leighton Roach, PT  ?Acute Rehab Services ? Pager [phone] ?Office [phone] ? ? ? ?[place] ?03/05/2022, 8:18 AM ?

## 2022-03-05 NOTE — Anesthesia Postprocedure Evaluation (Signed)
Anesthesia Post Note ? ?Patient: Elizabeth Brock ? ?Procedure(s) Performed: IR PERCUTANEOUS ART THROMBECTOMY/INFUSION INTRACRANIAL INC DIAG ANGIO ?IR CT HEAD LTD ? ?  ? ?Patient location during evaluation: PACU ?Anesthesia Type: General ?Level of consciousness: sedated ?Pain management: pain level controlled ?Vital Signs Assessment: post-procedure vital signs reviewed and stable ?Respiratory status: patient remains intubated per anesthesia plan ?Cardiovascular status: stable ?Postop Assessment: no apparent nausea or vomiting ?Anesthetic complications: no ? ? ?No notable events documented. ? ?Last Vitals:  ?Vitals:  ? 03/05/22 0700 03/05/22 0732  ?BP: 115/61 (!) 165/62  ?Pulse: (!) 56 61  ?Resp: 15 17  ?Temp:    ?SpO2: 100% 100%  ?  ?Last Pain:  ?Vitals:  ? 03/05/22 0425  ?TempSrc: Axillary  ?PainSc:   ? ? ?  ?  ?  ?  ?  ?  ? ?Lucretia Kern ? ? ? ? ?

## 2022-03-05 NOTE — Plan of Care (Signed)
?  Problem: Education: ?Goal: Knowledge of General Education information will improve ?Description: Including pain rating scale, medication(s)/side effects and non-pharmacologic comfort measures ?Outcome: Progressing ?  ?Problem: Elimination: ?Goal: Will not experience complications related to bowel motility ?Outcome: Progressing ?Goal: Will not experience complications related to urinary retention ?Outcome: Progressing ?  ?Problem: Pain Managment: ?Goal: General experience of comfort will improve ?Outcome: Progressing ?  ?Problem: Education: ?Goal: Knowledge of disease or condition will improve ?Outcome: Progressing ?  ?Problem: Coping: ?Goal: Will verbalize positive feelings about self ?Outcome: Progressing ?  ?

## 2022-03-05 NOTE — Plan of Care (Signed)
Interdisciplinary Goals of Care Family Meeting ? ? ?Date carried out:: 03/05/2022 ? ?Location of the meeting: Bedside ? ?Member's involved: Physician, Bedside Registered Nurse, and Family Member or next of kin ? ?Durable Power of Attorney or acting medical decision maker: Elizabeth Brock   ? ?Discussion: We discussed goals of care for Elizabeth Brock .   ? ?The Clinical status was relayed to patient's whole family including husband, sons daughters and granddaughters at bedside in detail. ?  ?Updated and notified of patients medical condition. ?  ?Patient is having a weak cough and struggling to remove secretions.   ?Recommend follow up NEUROLOGY RECS ?  ?Patient has extensive stroke with receptive and motor aphasia, probably dysphagia and high risk of aspiration pneumonia, patient's family decided to proceed with extubation, if she struggles they would like her to be reintubated and proceed with tracheostomy while keeping her full code and continue full scope of care ? ?Code status: Full Code ? ?Disposition: Continue current acute care ? ?  ?Family are satisfied with Plan of action and management. All questions answered ?  ?Cheri Fowler MD ?[facility] ?See Amion for pager ?If no response to pager, please call [phone] until 7pm ?After 7pm, Please call E-link [phone] ? ?

## 2022-03-05 NOTE — Progress Notes (Signed)
STROKE TEAM PROGRESS NOTE  ? ?SUBJECTIVE (INTERVAL HISTORY) ?No family is at the bedside.  Overall her condition is stable.  She is due intubated, but was on weaning.  We will have MRI and MRI done, likely followed by extubation. ? ? ?OBJECTIVE ?Temp:  [97 ?F (36.1 ?C)-98.1 ?F (36.7 ?C)] 97.4 ?F (36.3 ?C) (04/15 0800) ?Pulse Rate:  [51-76] 73 (04/15 1200) ?Cardiac Rhythm: Normal sinus rhythm (04/15 0800) ?Resp:  [11-20] 13 (04/15 1200) ?BP: (95-176)/(45-83) 123/54 (04/15 1200) ?SpO2:  [96 %-100 %] 100 % (04/15 1200) ?Arterial Line BP: (105-173)/(43-74) 138/46 (04/15 1200) ?FiO2 (%):  [40 %] 40 % (04/15 1115) ? ?Recent Labs  ?Lab 2022/03/21 ?1221  ?GLUCAP 120*  ? ?Recent Labs  ?Lab Mar 21, 2022 ?1221 2022-03-21 ?1226 03-21-22 ?2013 03/05/22 ?0503  ?NA 138 140 140 140  ?K 3.8 3.8 3.4* 3.7  ?CL 108 103  --  113*  ?CO2 23  --   --  21*  ?GLUCOSE 110* 108*  --  155*  ?BUN 16 18  --  13  ?CREATININE 0.99 1.00  --  0.86  ?CALCIUM 9.6  --   --  8.8*  ? ?Recent Labs  ?Lab 03-21-22 ?1221  ?AST 15  ?ALT 10  ?ALKPHOS 69  ?BILITOT 0.6  ?PROT 6.2*  ?ALBUMIN 3.4*  ? ?Recent Labs  ?Lab 21-Mar-2022 ?1221 2022/03/21 ?1226 03/21/2022 ?1949 2022/03/21 ?2013 03/05/22 ?0503  ?WBC 6.5  --  7.6  --  7.4  ?NEUTROABS 3.8  --   --   --  6.0  ?HGB 11.7* 11.9* 11.4* 10.9* 10.4*  ?HCT 35.6* 35.0* 34.0* 32.0* 30.0*  ?MCV 103.2*  --  100.9*  --  99.7  ?PLT 213  --  213  --  235  ? ?No results for input(s): CKTOTAL, CKMB, CKMBINDEX, TROPONINI in the last 168 hours. ?Recent Labs  ?  Mar 21, 2022 ?1221  ?LABPROT 13.9  ?INR 1.1  ? ?Recent Labs  ?  03/05/22 ?1017  ?COLORURINE YELLOW  ?LABSPEC 1.044*  ?PHURINE 5.0  ?GLUCOSEU NEGATIVE  ?HGBUR NEGATIVE  ?BILIRUBINUR NEGATIVE  ?KETONESUR NEGATIVE  ?PROTEINUR NEGATIVE  ?NITRITE NEGATIVE  ?LEUKOCYTESUR NEGATIVE  ?  ?   ?Component Value Date/Time  ? CHOL 125 03/05/2022 0503  ? TRIG 101 03/05/2022 0503  ? HDL 45 03/05/2022 0503  ? CHOLHDL 2.8 03/05/2022 0503  ? VLDL 20 03/05/2022 0503  ? LDLCALC 60 03/05/2022 0503  ? ?Lab Results   ?Component Value Date  ? HGBA1C 5.7 (H) 2022/03/21  ? ?No results found for: LABOPIA, COCAINSCRNUR, LABBENZ, AMPHETMU, THCU, LABBARB  ?Recent Labs  ?Lab 03-21-2022 ?1221  ?ETH <10  ? ? ?I have personally reviewed the radiological images below and agree with the radiology interpretations. ? ?DG CHEST PORT 1 VIEW ? ?Result Date: Mar 21, 2022 ?CLINICAL DATA:  Enteric tube placement. EXAM: PORTABLE CHEST 1 VIEW COMPARISON:  Chest x-ray from same day at 1827 hours. FINDINGS: Enteric tube tip in the stomach. Stable cardiomediastinal silhouette. Minimal bibasilar atelectasis/scarring. Visualized bowel gas pattern is nonobstructive. IMPRESSION: 1. Enteric tube in the stomach. Electronically Signed   By: Obie Dredge M.D.   On: 2022/03/21 21:05  ? ?DG CHEST PORT 1 VIEW ? ?Result Date: Mar 21, 2022 ?CLINICAL DATA:  Status post intubation. EXAM: PORTABLE CHEST 1 VIEW COMPARISON:  November 01, 2020 FINDINGS: Endotracheal tube is seen with limited visualization of its distal tip secondary to the presence of an overlying nasogastric tube. The distal tip appears to sit proximally 3.2 cm from the carina. The distal tip  of the nasogastric tube extends into the body of the stomach. Diffuse, chronic appearing increased lung markings are seen with mild, stable bibasilar atelectasis. There is no evidence of a pleural effusion or pneumothorax. The heart size and mediastinal contours are within normal limits. There is marked severity calcification of the aortic arch. The visualized skeletal structures are unremarkable. IMPRESSION: Endotracheal tube and nasogastric tube positioning, as described above. Electronically Signed   By: Aram Candela M.D.   On: 03/15/22 19:05  ? ?CT HEAD CODE STROKE WO CONTRAST ? ?Result Date: March 15, 2022 ?CLINICAL DATA:  Code stroke EXAM: CT ANGIOGRAPHY HEAD AND NECK TECHNIQUE: Multidetector CT imaging of the head and neck was performed using the standard protocol during bolus administration of intravenous  contrast. Multiplanar CT image reconstructions and MIPs were obtained to evaluate the vascular anatomy. Carotid stenosis measurements (when applicable) are obtained utilizing NASCET criteria, using the distal internal carotid diameter as the denominator. RADIATION DOSE REDUCTION: This exam was performed according to the departmental dose-optimization program which includes automated exposure control, adjustment of the mA and/or kV according to patient size and/or use of iterative reconstruction technique. CONTRAST:  68mL OMNIPAQUE IOHEXOL 350 MG/ML SOLN COMPARISON:  CT head 11/01/2020 FINDINGS: Brain: There is no evidence of acute intracranial hemorrhage, extra-axial fluid collection, or acute infarct. There is mild global parenchymal volume loss with prominence of the ventricular system and extra-axial CSF spaces. The ventricles are stable in size. Extensive hypodensity throughout the subcortical and periventricular white matter likely reflects sequela of advanced chronic white matter microangiopathy, unchanged. There is no mass lesion.  There is no mass effect or midline shift. Vascular: There is calcification of the bilateral cavernous ICAs and vertebral arteries. Skull: Normal. Negative for fracture or focal lesion. Sinuses/Orbits: There is opacification of the right sphenoid sinus with extensive surrounding hyperostosis consistent with chronic sinusitis, similar to the prior CT. Bilateral lens implants are in place. The globes and orbits are otherwise unremarkable. Other: None. ASPECTS [facility] Stroke Program Early CT Score) - Ganglionic level infarction (caudate, lentiform nuclei, internal capsule, insula, M1-M3 cortex): 7 - Supraganglionic infarction (M4-M6 cortex): 3 Total score (0-10 with 10 being normal): 10 CTA NECK FINDINGS Aortic arch: There is mild calcified atherosclerotic plaque of the aortic arch. The origins of the major branch vessels are patent. The subclavian arteries are patent to the level  imaged. Right carotid system: The right common, internal, and external carotid arteries are patent, without hemodynamically significant stenosis or occlusion. There is mild plaque at the bifurcation. There is no dissection or aneurysm. Left carotid system: The left common, internal, and external carotid arteries are patent, without hemodynamically significant stenosis or occlusion. There is no significant plaque at the bifurcation. There is no dissection or aneurysm. Vertebral arteries: The vertebral arteries are patent, without hemodynamically significant stenosis or occlusion. There is no dissection or aneurysm. Skeleton: There is age-indeterminate compression deformity of the T2 vertebral body. There is degenerative change of the C1-C2 articulation, worse on the left. There is no suspicious osseous lesion. There is no visible canal hematoma. Other neck: Soft tissues are unremarkable. Upper chest: Imaged lung apices are clear. Review of the MIP images confirms the above findings CTA HEAD FINDINGS Anterior circulation: There is mild calcified plaque of the intracranial ICAs without hemodynamically significant stenosis. The distal left M1 segment is occluded. There is diminutive reconstitution of flow in distal left MCA branches. The right MCA is patent. The bilateral ACAs are patent. The anterior communicating artery is normal. There is no  aneurysm or AVM. Posterior circulation: There is mild calcified plaque in the V4 segments without hemodynamically significant stenosis. There is a small nonocclusive filling defect in the distal left V4 segment just proximal to the vertebrobasilar junction, favored artifactual (7-142). PICA is identified bilaterally. The basilar artery is patent. The bilateral PCAs are patent. The posterior communicating arteries are not identified. There is no aneurysm or AVM. Venous sinuses: As permitted by contrast timing, patent. Anatomic variants: None. Review of the MIP images confirms the  above findings IMPRESSION: 1. Emergent left M1 occlusion. No evidence of evolved MCA infarct or intracranial hemorrhage. ASPECTS is 10 2. Otherwise, patent vasculature of the head and neck with no other signi

## 2022-03-05 NOTE — Progress Notes (Signed)
? ? ?Referring Physician(s): ?Dr Lavera Guise ? ?Supervising Physician: Luanne Bras ? ?Patient Status:  [facility] - In-pt ? ?Chief Complaint: ? ?CVA ?L MCA angioplasty/stent - NIR ? ?Subjective: ? 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 ?Status post endovascular revascularization of occluded left middle cerebral artery with 2 passes with a 5 mm x 37 mm embotrap retrieval device and contact aspiration, and 1 pass with a 4 mm x 40 mm solitary retrieval device and contact aspiration followed by rescue stenting using an Onyx 2.25 mm x 22 mm balloon mounted stent achieving aTICI 2B revascularization. ?Post CT of the brain demonstrates contrast stain in the left caudate head, in the left putamen ? ?Intubated ?Does open eyes to voice ?Some spontaneous movement on left ?None on Rt ? ?Dr Estanislado Pandy did update Dtr ? ? ? ?Allergies: ?Aspirin, Cortisone, and Penicillins ? ?Medications: ?Prior to Admission medications   ?Medication Sig Start Date End Date Taking? Authorizing Provider  ?amLODipine (NORVASC) 5 MG tablet Take 5 mg by mouth 2 (two) times daily. 12/20/21  Yes [provider]  ?bisoprolol-hydrochlorothiazide (ZIAC) 10-6.25 MG per tablet Take 1 tablet by mouth 2 (two) times daily.   Yes [provider]  ?cholecalciferol (VITAMIN D) 25 MCG (1000 UNIT) tablet Take 2,000 Units by mouth daily.   Yes [provider]  ?LORazepam (ATIVAN) 0.5 MG tablet Take 0.5 mg by mouth at bedtime as needed for anxiety or sleep.   Yes [provider]  ?losartan (COZAAR) 100 MG tablet Take 100 mg by mouth daily.   Yes [provider]  ?pantoprazole (PROTONIX) 40 MG tablet Take 40 mg by mouth daily as needed (GERD).   Yes [provider]  ?sertraline (ZOLOFT) 100 MG tablet Take 50 mg by mouth daily.   Yes [provider]  ?simvastatin (ZOCOR) 20 MG tablet Take 20 mg by mouth daily.   Yes [provider]  ?vitamin B-12 (CYANOCOBALAMIN) 100 MCG tablet Take 100 mcg by  mouth daily.   Yes [provider]  ?ALPRAZolam (XANAX) 0.25 MG tablet Take 0.25 mg by mouth 2 (two) times daily as needed for anxiety (anxiety). ?Patient not taking: Reported on 03/05/2022    [provider]  ? ? ? ?Vital Signs: ?BP (!) 115/54   Pulse 73   Temp (!) 97.4 ?F (36.3 ?C) (Axillary)   Resp 14   Ht 5' (1.524 m)   Wt 117 lb 4.6 oz (53.2 kg)   SpO2 100%   BMI 22.91 kg/m?  ? ?Physical Exam ?Vitals reviewed.  ?Constitutional:   ?   Comments: Intubated ?  ?Musculoskeletal:  ?   Comments: Some movement of left-(intubated and sedated) ?No movement on Rt  ?Skin: ?   Comments: Rt groin clean and dry ?No hematoma ?No bleeding ?Rt foot with good pulses  ? ? ?Imaging: ?DG CHEST PORT 1 VIEW ? ?Result Date: 03/03/2022 ?CLINICAL DATA:  Enteric tube placement. EXAM: PORTABLE CHEST 1 VIEW COMPARISON:  Chest x-ray from same day at 1827 hours. FINDINGS: Enteric tube tip in the stomach. Stable cardiomediastinal silhouette. Minimal bibasilar atelectasis/scarring. Visualized bowel gas pattern is nonobstructive. IMPRESSION: 1. Enteric tube in the stomach. Electronically Signed   By: Titus Dubin M.D.   On: 03/10/2022 21:05  ? ?DG CHEST PORT 1 VIEW ? ?Result Date: 03/11/2022 ?CLINICAL DATA:  Status post intubation. EXAM: PORTABLE CHEST 1 VIEW COMPARISON:  November 01, 2020 FINDINGS: Endotracheal tube is seen with limited visualization of its distal tip secondary to the presence  of an overlying nasogastric tube. The distal tip appears to sit proximally 3.2 cm from the carina. The distal tip of the nasogastric tube extends into the body of the stomach. Diffuse, chronic appearing increased lung markings are seen with mild, stable bibasilar atelectasis. There is no evidence of a pleural effusion or pneumothorax. The heart size and mediastinal contours are within normal limits. There is marked severity calcification of the aortic arch. The visualized skeletal structures are unremarkable. IMPRESSION:  Endotracheal tube and nasogastric tube positioning, as described above. Electronically Signed   By: Virgina Norfolk M.D.   On: 03/16/2022 19:05  ? ?CT HEAD CODE STROKE WO CONTRAST ? ?Result Date: 03/16/2022 ?CLINICAL DATA:  Code stroke EXAM: CT ANGIOGRAPHY HEAD AND NECK TECHNIQUE: Multidetector CT imaging of the head and neck was performed using the standard protocol during bolus administration of intravenous contrast. Multiplanar CT image reconstructions and MIPs were obtained to evaluate the vascular anatomy. Carotid stenosis measurements (when applicable) are obtained utilizing NASCET criteria, using the distal internal carotid diameter as the denominator. RADIATION DOSE REDUCTION: This exam was performed according to the departmental dose-optimization program which includes automated exposure control, adjustment of the mA and/or kV according to patient size and/or use of iterative reconstruction technique. CONTRAST:  40mL OMNIPAQUE IOHEXOL 350 MG/ML SOLN COMPARISON:  CT head 11/01/2020 FINDINGS: Brain: There is no evidence of acute intracranial hemorrhage, extra-axial fluid collection, or acute infarct. There is mild global parenchymal volume loss with prominence of the ventricular system and extra-axial CSF spaces. The ventricles are stable in size. Extensive hypodensity throughout the subcortical and periventricular white matter likely reflects sequela of advanced chronic white matter microangiopathy, unchanged. There is no mass lesion.  There is no mass effect or midline shift. Vascular: There is calcification of the bilateral cavernous ICAs and vertebral arteries. Skull: Normal. Negative for fracture or focal lesion. Sinuses/Orbits: There is opacification of the right sphenoid sinus with extensive surrounding hyperostosis consistent with chronic sinusitis, similar to the prior CT. Bilateral lens implants are in place. The globes and orbits are otherwise unremarkable. Other: None. ASPECTS [facility] Stroke  Program Early CT Score) - Ganglionic level infarction (caudate, lentiform nuclei, internal capsule, insula, M1-M3 cortex): 7 - Supraganglionic infarction (M4-M6 cortex): 3 Total score (0-10 with 10 being normal): 10 CTA NECK FINDINGS Aortic arch: There is mild calcified atherosclerotic plaque of the aortic arch. The origins of the major branch vessels are patent. The subclavian arteries are patent to the level imaged. Right carotid system: The right common, internal, and external carotid arteries are patent, without hemodynamically significant stenosis or occlusion. There is mild plaque at the bifurcation. There is no dissection or aneurysm. Left carotid system: The left common, internal, and external carotid arteries are patent, without hemodynamically significant stenosis or occlusion. There is no significant plaque at the bifurcation. There is no dissection or aneurysm. Vertebral arteries: The vertebral arteries are patent, without hemodynamically significant stenosis or occlusion. There is no dissection or aneurysm. Skeleton: There is age-indeterminate compression deformity of the T2 vertebral body. There is degenerative change of the C1-C2 articulation, worse on the left. There is no suspicious osseous lesion. There is no visible canal hematoma. Other neck: Soft tissues are unremarkable. Upper chest: Imaged lung apices are clear. Review of the MIP images confirms the above findings CTA HEAD FINDINGS Anterior circulation: There is mild calcified plaque of the intracranial ICAs without hemodynamically significant stenosis. The distal left M1 segment is occluded. There is diminutive reconstitution of flow in distal left MCA  branches. The right MCA is patent. The bilateral ACAs are patent. The anterior communicating artery is normal. There is no aneurysm or AVM. Posterior circulation: There is mild calcified plaque in the V4 segments without hemodynamically significant stenosis. There is a small nonocclusive  filling defect in the distal left V4 segment just proximal to the vertebrobasilar junction, favored artifactual (7-142). PICA is identified bilaterally. The basilar artery is patent. The bilateral PCAs are patent. The poster

## 2022-03-06 ENCOUNTER — Inpatient Hospital Stay (HOSPITAL_COMMUNITY): Payer: Medicare Other

## 2022-03-06 DIAGNOSIS — I63512 Cerebral infarction due to unspecified occlusion or stenosis of left middle cerebral artery: Secondary | ICD-10-CM | POA: Diagnosis not present

## 2022-03-06 DIAGNOSIS — J9601 Acute respiratory failure with hypoxia: Secondary | ICD-10-CM

## 2022-03-06 DIAGNOSIS — I6602 Occlusion and stenosis of left middle cerebral artery: Secondary | ICD-10-CM | POA: Diagnosis not present

## 2022-03-06 LAB — BASIC METABOLIC PANEL
Anion gap: 8 (ref 5–15)
BUN: 10 mg/dL (ref 8–23)
CO2: 20 mmol/L — ABNORMAL LOW (ref 22–32)
Calcium: 8.6 mg/dL — ABNORMAL LOW (ref 8.9–10.3)
Chloride: 112 mmol/L — ABNORMAL HIGH (ref 98–111)
Creatinine, Ser: 0.89 mg/dL (ref 0.44–1.00)
GFR, Estimated: >60 mL/min (ref >60)
Glucose, Bld: 99 mg/dL (ref 70–99)
Potassium: 3.2 mmol/L — ABNORMAL LOW (ref 3.5–5.1)
Sodium: 140 mmol/L (ref 135–145)

## 2022-03-06 LAB — CBC
HCT: 26.2 % — ABNORMAL LOW (ref 36.0–46.0)
Hemoglobin: 8.9 g/dL — ABNORMAL LOW (ref 12.0–15.0)
MCH: 34.5 pg — ABNORMAL HIGH (ref 26.0–34.0)
MCHC: 34 g/dL (ref 30.0–36.0)
MCV: 101.6 fL — ABNORMAL HIGH (ref 80.0–100.0)
Platelets: 218 10*3/uL (ref 150–400)
RBC: 2.58 MIL/uL — ABNORMAL LOW (ref 3.87–5.11)
RDW: 14.1 % (ref 11.5–15.5)
WBC: 12.9 10*3/uL — ABNORMAL HIGH (ref 4.0–10.5)
nRBC: 0 % (ref 0.0–0.2)

## 2022-03-06 LAB — GLUCOSE, CAPILLARY
Glucose-Capillary: 124 mg/dL — ABNORMAL HIGH (ref 70–99)
Glucose-Capillary: 122 mg/dL — ABNORMAL HIGH (ref 70–99)

## 2022-03-06 LAB — PHOSPHORUS: Phosphorus: 2.7 mg/dL (ref 2.5–4.6)

## 2022-03-06 LAB — MAGNESIUM: Magnesium: 1.6 mg/dL — ABNORMAL LOW (ref 1.7–2.4)

## 2022-03-06 MED ORDER — PROSOURCE TF PO LIQD
45.0000 mL | Freq: Two times a day (BID) | ORAL | Status: DC
  Administered 2022-03-06 – 2022-03-07 (×2): 45 mL
  Filled 2022-03-06: qty 45

## 2022-03-06 MED ORDER — AMLODIPINE BESYLATE 10 MG PO TABS
10.0000 mg | ORAL_TABLET | Freq: Every day | ORAL | Status: DC
  Administered 2022-03-06 – 2022-03-07 (×2): 10 mg
  Filled 2022-03-06: qty 1

## 2022-03-06 MED ORDER — AMLODIPINE BESYLATE 10 MG PO TABS
10.0000 mg | ORAL_TABLET | Freq: Every day | ORAL | Status: DC
  Filled 2022-03-06: qty 1

## 2022-03-06 MED ORDER — SIMVASTATIN 20 MG PO TABS
20.0000 mg | ORAL_TABLET | Freq: Every evening | ORAL | Status: DC
  Administered 2022-03-06 – 2022-03-07 (×2): 20 mg
  Filled 2022-03-06: qty 1

## 2022-03-06 MED ORDER — POTASSIUM CHLORIDE 20 MEQ PO PACK
40.0000 meq | PACK | ORAL | Status: AC
  Administered 2022-03-06 (×2): 40 meq
  Filled 2022-03-06 (×2): qty 2

## 2022-03-06 MED ORDER — ORAL CARE MOUTH RINSE
15.0000 mL | Freq: Two times a day (BID) | OROMUCOSAL | Status: DC
  Administered 2022-03-06 – 2022-03-07 (×4): 15 mL via OROMUCOSAL

## 2022-03-06 MED ORDER — VITAL HIGH PROTEIN PO LIQD
1000.0000 mL | ORAL | Status: DC
  Administered 2022-03-06: 1000 mL

## 2022-03-06 NOTE — Evaluation (Signed)
Physical Therapy Evaluation ?Patient Details ?Name: Elizabeth Brock ?MRN: [id number] ?DOB: 30-Jun-1933 ?Today's Date: 03/06/2022 ? ?History of Present Illness ? Pt is 88 yo female who presents with R sided weakness, L gaze deviation, and R hemianopsia after being found down by husband. MRI shows moderate infarct of L MCA, small scattered infarcts of R MCA/ACA. Received TNKase and underwent revascularization of L MCA. Extubated 4/15. PMH: GERD, HTN, HLD. ?  ?Clinical Impression ? Pt in bed upon arrival of PT, agreeable to evaluation at this time. Prior to admission the pt was mobilizing with use of RW, independent with ADLs, living with her spouse who assists with some IADLs and driving. The pt now presents with limitations in functional mobility, strength, ROM, seated stability, command following, and activity tolerance due to above dx, and will continue to benefit from skilled PT to address these deficits. The pt required totalA of 2 to complete transfer to sitting EOB at this time, and then required totalA to maintain static sitting at EOB. She did not demo any movements to command during this session, but did show purposeful movement of reaching for NG tube with LUE. No visual tracking and pt maintaining L gaze preference with L cervical sidebend and rotation despite attempts at stretching and PROM. Family present and hopeful for acute inpatient rehab when medically stable and appropriate given the pt's prior independence. Will continue to benefit from skilled PT acutely and after d/c to progress activity tolerance, strength, stability, and command following.  ?   ?   ? ?Recommendations for follow up therapy are one component of a multi-disciplinary discharge planning process, led by the attending physician.  Recommendations may be updated based on patient status, additional functional criteria and insurance authorization. ? ?Follow Up Recommendations Acute inpatient rehab (3hours/day) (when medically  appropriate) ? ?  ?Assistance Recommended at Discharge Frequent or constant Supervision/Assistance  ?Patient can return home with the following ? Two people to help with walking and/or transfers;Two people to help with bathing/dressing/bathroom;Assistance with cooking/housework;Assistance with feeding;Direct supervision/assist for medications management;Direct supervision/assist for financial management;Assist for transportation;Help with stairs or ramp for entrance ? ?  ?Equipment Recommendations Wheelchair (measurements PT);Wheelchair cushion (measurements PT);Hospital bed (lift)  ?Recommendations for Other Services ? Rehab consult  ?  ?Functional Status Assessment Patient has had a recent decline in their functional status and demonstrates the ability to make significant improvements in function in a reasonable and predictable amount of time.  ? ?  ?Precautions / Restrictions Precautions ?Precautions: Fall ?Precaution Comments: SBP < 180 ?Restrictions ?Weight Bearing Restrictions: No  ? ?  ? ?Mobility ? Bed Mobility ?Overal bed mobility: Needs Assistance ?Bed Mobility: Supine to Sit, Sit to Supine ?  ?  ?Supine to sit: Total assist, +2 for physical assistance ?Sit to supine: Total assist, +2 for physical assistance ?  ?General bed mobility comments: pt with no active assist to move either LE to EOB or elevate trunk, total A to maintain static sitting, at times pt pushing with LUE ?  ? ?Transfers ?  ?  ?  ?  ?  ?  ?  ?  ?  ?General transfer comment: pt fatigued after sitting EOB, no active command folliwng with LE, deferred OOB transfer ?  ? ?  ? ?Balance Overall balance assessment: Needs assistance ?Sitting-balance support: Single extremity supported ?Sitting balance-Leahy Scale: Zero ?Sitting balance - Comments: dependent on assist from therapist ?Postural control: Right lateral lean ?  ?  ?  ?  ?  ?  ?  ?  ?  ?  ?  ?  ?  ?  ?   ? ? ? ?  Pertinent Vitals/Pain Pain Assessment ?Pain Assessment: Faces ?Faces Pain  Scale: Hurts a little bit ?Pain Location: grimacing with PROM of neck ?Pain Descriptors / Indicators: Grimacing ?Pain Intervention(s): Limited activity within patient's tolerance, Monitored during session, Repositioned  ? ? ?Home Living Family/patient expects to be discharged to:: Private residence ?Living Arrangements: Spouse/significant other ?Available Help at Discharge: Family;Available PRN/intermittently (spouse 24/7, limited ability to physically assist. family can be more available as needed) ?Type of Home: House ?  ?  ?  ?  ?  ?  ?   ?  ?Prior Function Prior Level of Function : Needs assist ?  ?  ?  ?Physical Assist : Mobility (physical);ADLs (physical) ?Mobility (physical): Gait;Stairs ?ADLs (physical): IADLs ?Mobility Comments: pt family present and state she lives with spouse, neither with great mobility, pt using RW and spouse with cane. state she didnt need assist to walk, but had poor endurance and recently had PT to improve balance which the pt did not enjoy but did benefit from ?ADLs Comments: pt spouse does a lot of IADls and driving for the couple, family states she was independent with ADLs as far as they know ?  ? ? ?Hand Dominance  ?   ? ?  ?Extremity/Trunk Assessment  ? Upper Extremity Assessment ?Upper Extremity Assessment: Defer to OT evaluation;RUE deficits/detail ?RUE Deficits / Details: RUE flaccid with swelling of hand and forearm, left elevated ?  ? ?Lower Extremity Assessment ?Lower Extremity Assessment: RLE deficits/detail;LLE deficits/detail ?RLE Deficits / Details: flaccid with no spontaneous movement or trace activation to command ?LLE Deficits / Details: inconsistent movement difficult to discern spontaneous vs attempting to follow commands. unable to SLR or heel slide to command, WFL PROM. once transitioned to sitting, significant knee extensor tone, needing mod pressure to flex knee and bring foot to floor, frequently returned to L knee extension throughout session ?  ? ?Cervical  / Trunk Assessment ?Cervical / Trunk Assessment: Kyphotic;Other exceptions ?Cervical / Trunk Exceptions: L gaze, unable to reach midline, no active cervical rotation during session  ?Communication  ? Communication: Expressive difficulties;Receptive difficulties (global aphasia)  ?Cognition Arousal/Alertness: Awake/alert ?Behavior During Therapy: Flat affect ?Overall Cognitive Status: Difficult to assess ?  ?  ?  ?  ?  ?  ?  ?  ?  ?  ?  ?  ?  ?  ?  ?  ?General Comments: pt at times looking towards family on L side, not visually tracking or maintaining eye contact. no consistent command following of any extremity through session. ?  ?  ? ?  ?General Comments General comments (skin integrity, edema, etc.): VSS with BP under 180 ? ?  ?Exercises    ? ?Assessment/Plan  ?  ?PT Assessment Patient needs continued PT services  ?PT Problem List Decreased strength;Decreased range of motion;Decreased activity tolerance;Decreased balance;Decreased mobility;Decreased coordination;Decreased cognition;Decreased safety awareness ? ?   ?  ?PT Treatment Interventions DME instruction;Gait training;Stair training;Functional mobility training;Therapeutic activities;Therapeutic exercise;Balance training;Neuromuscular re-education;Patient/family education   ? ?PT Goals (Current goals can be found in the Care Plan section)  ?Acute Rehab PT Goals ?Patient Stated Goal: to go to [facility] ?PT Goal Formulation: With patient/family ?Time For Goal Achievement: 03/20/22 ?Potential to Achieve Goals: Fair ? ?  ?Frequency Min 4X/week ?  ? ? ?   ?AM-PAC PT "6 Clicks" Mobility  ?Outcome Measure Help needed turning from your back to your side while in a flat bed without using bedrails?: Total ?Help needed moving from lying on your back to sitting  on the side of a flat bed without using bedrails?: Total ?Help needed moving to and from a bed to a chair (including a wheelchair)?: Total ?Help needed standing up from a chair using your arms (e.g., wheelchair or  bedside chair)?: Total ?Help needed to walk in hospital room?: Total ?Help needed climbing 3-5 steps with a railing? : Total ?6 Click Score: 6 ? ?  ?End of Session   ?Activity Tolerance: Patient limited by fatigue ?Pati

## 2022-03-06 NOTE — Progress Notes (Signed)
? ? ?Referring Physician(s): ?Dr Erlinda Hong ? ?Supervising Physician: Luanne Bras ? ?Patient Status:  [facility] - In-pt ? ?Chief Complaint: ? ?CVA ?L MCA angioplasty/stent - NIR 03/14/2022 ? ?Subjective: ? ?Extubated ?Left gaze ?Opens eyes-- not to command ?Makes a grunting noise ? ?MRA yesterday:  IMPRESSION: ?1. Patent left M1 segment with signal to the stent. ?2. Posterior branches distal to the stent less prominent than on the ?previous study. ?3. Persistent occlusion of anterior branches. ?4. Right MCA, bilateral ACA, and posterior circulation within normal ?limits. ? ?MRI:  IMPRESSION: ?1. Acute/subacute nonhemorrhagic infarct involving the left ?lentiform nucleus, anterior insular cortex, left corona radiata, and ?scattered areas in the high left frontal and parietal cortex. ?2. Additional posterior left MCA territory infarct, potentially near ?the watershed involving the posterior left parietal lobe and ?temporal lobe. ?3. Single 6 mm cortical infarct in the anterior right frontal lobe. ?4. Remote infarcts of the posterior cerebellum bilaterally. ?5. Diffuse periventricular and subcortical white matter disease ?bilaterally likely reflects the sequela of chronic microvascular ?ischemia. ? ?No change in status ? ? ?Allergies: ?Aspirin, Cortisone, and Penicillins ? ?Medications: ?Prior to Admission medications   ?Medication Sig Start Date End Date Taking? Authorizing Provider  ?amLODipine (NORVASC) 5 MG tablet Take 5 mg by mouth 2 (two) times daily. 12/20/21  Yes [provider]  ?bisoprolol-hydrochlorothiazide (ZIAC) 10-6.25 MG per tablet Take 1 tablet by mouth 2 (two) times daily.   Yes [provider]  ?cholecalciferol (VITAMIN D) 25 MCG (1000 UNIT) tablet Take 2,000 Units by mouth daily.   Yes [provider]  ?LORazepam (ATIVAN) 0.5 MG tablet Take 0.5 mg by mouth at bedtime as needed for anxiety or sleep.   Yes [provider]  ?losartan (COZAAR) 100 MG tablet Take 100 mg by mouth  daily.   Yes [provider]  ?pantoprazole (PROTONIX) 40 MG tablet Take 40 mg by mouth daily as needed (GERD).   Yes [provider]  ?sertraline (ZOLOFT) 100 MG tablet Take 50 mg by mouth daily.   Yes [provider]  ?simvastatin (ZOCOR) 20 MG tablet Take 20 mg by mouth daily.   Yes [provider]  ?vitamin B-12 (CYANOCOBALAMIN) 100 MCG tablet Take 100 mcg by mouth daily.   Yes [provider]  ?ALPRAZolam (XANAX) 0.25 MG tablet Take 0.25 mg by mouth 2 (two) times daily as needed for anxiety (anxiety). ?Patient not taking: Reported on 03/05/2022    [provider]  ? ? ? ?Vital Signs: ?BP (!) 149/86   Pulse (!) 106   Temp 99.6 ?F (37.6 ?C) (Axillary)   Resp 20   Ht 5' (1.524 m)   Wt 117 lb 4.6 oz (53.2 kg)   SpO2 95%   BMI 22.91 kg/m?  ? ?Extubated ?Left gaze ?Opens eyes--- does not follow any commands ?No response ?No movement noted ? ?Imaging: ?DG Abd 1 View ? ?Result Date: 03/06/2022 ?CLINICAL DATA:  Nasogastric tube placement. EXAM: ABDOMEN - 1 VIEW COMPARISON:  03/05/2022 FINDINGS: Nasogastric tube remains in place with tip overlying the gastric antrum. No evidence of dilated bowel loops. IMPRESSION: Nasogastric tube tip overlies the gastric antrum. Electronically Signed   By: Marlaine Hind M.D.   On: 03/06/2022 09:10  ? ?DG Abd 1 View ? ?Result Date: 03/05/2022 ?CLINICAL DATA:  OG tube placement EXAM: ABDOMEN - 1 VIEW COMPARISON:  None. FINDINGS: Esophageal tube tip overlies the gastric body. Atelectasis or scar at the left base. Visible gas pattern is unobstructed IMPRESSION: Esophageal tube  tip overlies the body of the stomach Electronically Signed   By: Donavan Foil M.D.   On: 03/05/2022 19:53  ? ?MR ANGIO HEAD WO CONTRAST ? ?Result Date: 03/05/2022 ?CLINICAL DATA:  Stroke, follow-up. Status post revascularization of left M1 occlusion. EXAM: MRA HEAD WITHOUT CONTRAST TECHNIQUE: Angiographic images of the Circle of Willis were acquired using MRA  technique without intravenous contrast. FINDINGS: Anterior circulation: The right internal carotid artery, A1 segment and M1 segments are normal. Right ACA and MCA branch vessels are normal. The left M1 segment remains patent with signal to the stent. Artifact noted at the stent. Anterior branch vessels are not seen. Posterior circulation: The left vertebral artery is dominant. Basilar artery is within normal limits. Posterior cerebral arteries originate from the basilar tip. PCA branch vessels are within normal limits. Anatomic variants: None Other: None. IMPRESSION: 1. Patent left M1 segment with signal to the stent. 2. Posterior branches distal to the stent less prominent than on the previous study. 3. Persistent occlusion of anterior branches. 4. Right MCA, bilateral ACA, and posterior circulation within normal limits. Electronically Signed   By: San Morelle M.D.   On: 03/05/2022 15:04  ? ?MR BRAIN WO CONTRAST ? ?Result Date: 03/05/2022 ?CLINICAL DATA:  Neuro deficit, acute, stroke suspected. Sudden onset right-sided weakness, left gaze deviation and right hemianopsia. Patient was found unresponsive. EXAM: MRI HEAD WITHOUT CONTRAST TECHNIQUE: Multiplanar, multiecho pulse sequences of the brain and surrounding structures were obtained without intravenous contrast. COMPARISON:  CT head without contrast 03/06/2022 and CTA head and neck 03/07/2022 FINDINGS: Brain: Restricted diffusion is present with acute nonhemorrhagic infarct involving the left lentiform nucleus, the anterior insular cortex, left corona radiata and scattered areas in the high left frontal and parietal cortex. A single cortical infarct in the anterior right frontal lobe measures 6 mm on image 39 of series 2. A separate posterior MCA territory infarct involves the posterior left parietal and temporal lobe, potentially near the watershed. T2 hyperintensities are associated with these areas of acute/subacute infarction. Diffuse  periventricular and subcortical white matter changes are also present bilaterally. White matter changes extend into the brainstem. Remote infarcts are present posterior cerebellum bilaterally without hemorrhage. The ventricles are of normal size. No significant extraaxial fluid collection is present. The internal auditory canals are within normal limits. Vascular: Flow is present in the major intracranial arteries. Skull and upper cervical spine: The craniocervical junction is normal. Upper cervical spine is within normal limits. Marrow signal is unremarkable. Sinuses/Orbits: Right sphenoid sinus is opacified. The paranasal sinuses and mastoid air cells are otherwise clear. Bilateral lens replacements are noted. Globes and orbits are otherwise unremarkable. IMPRESSION: 1. Acute/subacute nonhemorrhagic infarct involving the left lentiform nucleus, anterior insular cortex, left corona radiata, and scattered areas in the high left frontal and parietal cortex. 2. Additional posterior left MCA territory infarct, potentially near the watershed involving the posterior left parietal lobe and temporal lobe. 3. Single 6 mm cortical infarct in the anterior right frontal lobe. 4. Remote infarcts of the posterior cerebellum bilaterally. 5. Diffuse periventricular and subcortical white matter disease bilaterally likely reflects the sequela of chronic microvascular ischemia. Electronically Signed   By: San Morelle M.D.   On: 03/05/2022 14:58  ? ?DG CHEST PORT 1 VIEW ? ?Result Date: 03/01/2022 ?CLINICAL DATA:  Enteric tube placement. EXAM: PORTABLE CHEST 1 VIEW COMPARISON:  Chest x-ray from same day at 1827 hours. FINDINGS: Enteric tube tip in the stomach. Stable cardiomediastinal silhouette. Minimal bibasilar atelectasis/scarring. Visualized bowel gas pattern  is nonobstructive. IMPRESSION: 1. Enteric tube in the stomach. Electronically Signed   By: Titus Dubin M.D.   On: 03/12/2022 21:05  ? ?DG CHEST PORT 1  VIEW ? ?Result Date: 03/20/2022 ?CLINICAL DATA:  Status post intubation. EXAM: PORTABLE CHEST 1 VIEW COMPARISON:  November 01, 2020 FINDINGS: Endotracheal tube is seen with limited visualization of its distal tip secondary to the pres

## 2022-03-06 NOTE — Evaluation (Signed)
Clinical/Bedside Swallow Evaluation ?Patient Details  ?Name: Elizabeth Brock ?MRN: [id number] ?Date of Birth: 05-09-1933 ? ?Today's Date: 03/06/2022 ?Time: SLP Start Time (ACUTE ONLY): 0946 SLP Stop Time (ACUTE ONLY): 1008 ?SLP Time Calculation (min) (ACUTE ONLY): 22 min ? ?Past Medical History:  ?Past Medical History:  ?Diagnosis Date  ? Cataract   ? GERD (gastroesophageal reflux disease)   ? Hyperlipemia   ? Hypertension   ? Osteoporosis   ? Renal disorder   ? Vitamin D deficiency disease   ? ?Past Surgical History:  ?Past Surgical History:  ?Procedure Laterality Date  ? APPENDECTOMY    ? ?HPI:  ?88 y.o. female with PMH significant for GERD, hypertension, hyperlipidemia, osteoporosis, vitamin D deficiency who presents with sudden onset right-sided weakness, left gaze deviation, right hemianopsia.    She woke up this morning and was at her baseline.  Usually she is able to walk around by herself, uses her iPad and is able to read.  She is able to dress and dress herself, shower by herself and can fix herself light meals.  Husband last saw her at her baseline at 14 00 on 03/08/2022.  He went out to run some errands and return to find her unresponsive on the floor.  He called EMS who noted the right-sided weakness and brought her in as a code stroke; MRI brain 03/05/22 indicated Acute/subacute nonhemorrhagic infarct involving the left  lentiform nucleus, anterior insular cortex, left corona radiata, and  scattered areas in the high left frontal and parietal cortex.  2. Additional posterior left MCA territory infarct, potentially near  the watershed involving the posterior left parietal lobe and  temporal lobe.  3. Single 6 mm cortical infarct in the anterior right frontal lobe.  4. Remote infarcts of the posterior cerebellum bilaterally; BSE generated.  ?  ?Assessment / Plan / Recommendation  ?Clinical Impression ? Pt presents with oropharyngeal dysphagia with anterior loss on R, poor bolus cohesion/propulsion and a  delay in the initiation of the swallow with ice chips/puree.  Delayed coughing noted after each presentation.  Pt's mentation decreased/lethargy also impacted swallow as pt exhibited difficulty following directions d/t global aphasia and extent/nature of CVA. OME unable to be fully completed d/t aphasia.  Recommend NPO status d/t decreased cognition/alertness level and mod-severe risk for aspiration with PO intake at current time.  Discussed evaluation results with family and need for simple language when communicating with pt.  Family in agreement with POC.  Thank you for this consult. ?  ?SLP Visit Diagnosis: Dysphagia, oropharyngeal phase (R13.12) ?   ?Aspiration Risk ? Moderate aspiration risk;Severe aspiration risk;Risk for inadequate nutrition/hydration  ?  ?Diet Recommendation   NPO ? ?Medication Administration: Via alternative means  ?  ?Other  Recommendations Oral Care Recommendations: Oral care QID   ? ?Recommendations for follow up therapy are one component of a multi-disciplinary discharge planning process, led by the attending physician.  Recommendations may be updated based on patient status, additional functional criteria and insurance authorization. ? ?Follow up Recommendations Acute inpatient rehab (3hours/day)  ? ? ?  ?Assistance Recommended at Discharge Frequent or constant Supervision/Assistance  ?Functional Status Assessment Patient has had a recent decline in their functional status and demonstrates the ability to make significant improvements in function in a reasonable and predictable amount of time.  ?Frequency and Duration min 2x/week  ?1 week ?  ?   ? ?Prognosis Prognosis for Safe Diet Advancement: Fair ?Barriers to Reach Goals: Cognitive deficits;Severity of deficits  ? ?  ? ?  Swallow Study   ?General Date of Onset: 03/01/2022 ?HPI: 88 y.o. female with PMH significant for GERD, hypertension, hyperlipidemia, osteoporosis, vitamin D deficiency who presents with sudden onset right-sided  weakness, left gaze deviation, right hemianopsia.    She woke up this morning and was at her baseline.  Usually she is able to walk around by herself, uses her iPad and is able to read.  She is able to dress and dress herself, shower by herself and can fix herself light meals.  Husband last saw her at her baseline at 79 00 on 03/20/2022.  He went out to run some errands and return to find her unresponsive on the floor.  He called EMS who noted the right-sided weakness and brought her in as a code stroke; MRI brain 03/05/22 indicated Acute/subacute nonhemorrhagic infarct involving the left  lentiform nucleus, anterior insular cortex, left corona radiata, and  scattered areas in the high left frontal and parietal cortex.  2. Additional posterior left MCA territory infarct, potentially near  the watershed involving the posterior left parietal lobe and  temporal lobe.  3. Single 6 mm cortical infarct in the anterior right frontal lobe.  4. Remote infarcts of the posterior cerebellum bilaterally; BSE generated. ?Type of Study: Bedside Swallow Evaluation ?Previous Swallow Assessment: [facility] swallow screen not passed ?Diet Prior to this Study: NPO ?Temperature Spikes Noted: Yes (low grade) ?Respiratory Status: Nasal cannula (2L) ?History of Recent Intubation: No ?Behavior/Cognition: Lethargic/Drowsy;Requires cueing;Doesn't follow directions ?Oral Cavity Assessment: Excessive secretions ?Oral Care Completed by SLP: Recent completion by staff ?Oral Cavity - Dentition: Missing dentition ?Vision: Impaired for self-feeding ?Self-Feeding Abilities: Total assist ?Patient Positioning: Upright in bed ?Baseline Vocal Quality: Not observed ?Volitional Cough: Congested;Strong ?Volitional Swallow: Unable to elicit  ?  ?Oral/Motor/Sensory Function Overall Oral Motor/Sensory Function: Moderate impairment ?Facial ROM: Reduced right ?Facial Symmetry: Abnormal symmetry right ?Facial Strength: Reduced right ?Lingual ROM: Other (Comment)  (UTA) ?Lingual Symmetry: Other (Comment) (UTA) ?Lingual Strength: Reduced   ?Ice Chips Ice chips: Impaired ?Presentation: Spoon ?Oral Phase Impairments: Impaired mastication;Reduced lingual movement/coordination;Poor awareness of bolus ?Oral Phase Functional Implications: Prolonged oral transit;Oral holding ?Pharyngeal Phase Impairments: Cough - Delayed;Suspected delayed Swallow;Multiple swallows   ?Thin Liquid Thin Liquid: Not tested  ?  ?Nectar Thick Nectar Thick Liquid: Not tested   ?Honey Thick Honey Thick Liquid: Not tested   ?Puree Puree: Not tested   ?Solid ? ? ?  Solid: Not tested  ? ?  ? ?Elvina Sidle, M.S., CCC-SLP ?03/06/2022,1:20 PM ? ? ? ?

## 2022-03-06 NOTE — Evaluation (Addendum)
Speech Language Pathology Evaluation ?Patient Details ?Name: Elizabeth Brock ?MRN: [id number] ?DOB: 19-Feb-1933 ?Today's Date: 03/06/2022 ?Time: [id number] ?SLP Time Calculation (min) (ACUTE ONLY): 22 min ? ?Problem List:  ?Patient Active Problem List  ? Diagnosis Date Noted  ? Ventilator dependence ([place])   ? Acute ischemic left MCA stroke ([place]) 03/08/2022  ? Middle cerebral artery embolism, left 03/15/2022  ? ?Past Medical History:  ?Past Medical History:  ?Diagnosis Date  ? Cataract   ? GERD (gastroesophageal reflux disease)   ? Hyperlipemia   ? Hypertension   ? Osteoporosis   ? Renal disorder   ? Vitamin D deficiency disease   ? ?Past Surgical History:  ?Past Surgical History:  ?Procedure Laterality Date  ? APPENDECTOMY    ? ?HPI:  ?88 y.o. female with PMH significant for GERD, hypertension, hyperlipidemia, osteoporosis, vitamin D deficiency who presents with sudden onset right-sided weakness, left gaze deviation, right hemianopsia.    She woke up this morning and was at her baseline.  Usually she is able to walk around by herself, uses her iPad and is able to read.  She is able to dress and dress herself, shower by herself and can fix herself light meals.  Husband last saw her at her baseline at 12 00 on 02/22/2022.  He went out to run some errands and return to find her unresponsive on the floor.  He called EMS who noted the right-sided weakness and brought her in as a code stroke; MRI brain 03/05/22 indicated Acute/subacute nonhemorrhagic infarct involving the left  lentiform nucleus, anterior insular cortex, left corona radiata, and  scattered areas in the high left frontal and parietal cortex.  2. Additional posterior left MCA territory infarct, potentially near  the watershed involving the posterior left parietal lobe and  temporal lobe.  3. Single 6 mm cortical infarct in the anterior right frontal lobe.  4. Remote infarcts of the posterior cerebellum bilaterally; SLE generated  ? ?Assessment / Plan /  Recommendation ?Clinical Impression ? Pt exhibits global aphasia with 1-step commands followed 10% of the time with max cues and eye contact only form of communication noted with pt remaining nonverbal throughout evaluation.  L gaze deviation/inattention decreased during evaluation.  OME revealed R facial hemiparesis with pt opening mouth for intake of limited PO presentations, but oral motor movements not followed despite max cueing from SLP/family. Pt's level of alertness was decreased/lethargy impacted overall assessment.  Recommend ST f/u for initiating a communication method, addressing expressive/receptive aphasic components, cognition and dysphagia tx during acute stay.  Thank you for this consult. ?   ?SLP Assessment ? SLP Recommendation/Assessment: Patient needs continued Speech Language Pathology Services ?SLP Visit Diagnosis: Aphasia (R47.01);Cognitive communication deficit (R41.841)  ?  ?Recommendations for follow up therapy are one component of a multi-disciplinary discharge planning process, led by the attending physician.  Recommendations may be updated based on patient status, additional functional criteria and insurance authorization. ?   ?Follow Up Recommendations ? Acute inpatient rehab (3hours/day)  ?  ?Assistance Recommended at Discharge ? Frequent or constant Supervision/Assistance  ?Functional Status Assessment Patient has had a recent decline in their functional status and demonstrates the ability to make significant improvements in function in a reasonable and predictable amount of time.  ?Frequency and Duration min 2x/week  ?1 week ?  ?   ?SLP Evaluation ?Cognition ? Overall Cognitive Status: Difficult to assess ?Arousal/Alertness: Lethargic ?Orientation Level: Other (comment) (DTA d/t lethargy) ?Attention: Focused ?Focused Attention: Impaired ?Focused Attention Impairment: Verbal basic;Functional basic ?  Memory:  (UTA) ?Awareness: Impaired ?Awareness Impairment: Emergent impairment  ?  ?    ?Comprehension ? Auditory Comprehension ?Overall Auditory Comprehension: Impaired ?Yes/No Questions: Impaired ?Basic Biographical Questions: 0-25% accurate ?Basic Immediate Environment Questions: 0-24% accurate ?Commands: Impaired ?One Step Basic Commands: 25-49% accurate ?Conversation: Other (comment) (UTA) ?Visual Recognition/Discrimination ?Discrimination: Not tested ?Reading Comprehension ?Reading Status: Not tested  ?  ?Expression Expression ?Primary Mode of Expression: Other (comment) (No communication initiated besides eye contact infrequently with max cues) ?Verbal Expression ?Overall Verbal Expression: Impaired ?Initiation: Impaired ?Naming: Not tested ?Non-Verbal Means of Communication: Not applicable ?Written Expression ?Written Expression: Unable to assess (comment)   ?Oral / Motor ? Oral Motor/Sensory Function ?Overall Oral Motor/Sensory Function: Moderate impairment ?Facial ROM: Reduced right ?Facial Symmetry: Abnormal symmetry right ?Facial Strength: Reduced right ?Lingual ROM: Other (Comment) (UTA) ?Lingual Symmetry: Other (Comment) (UTA) ?Lingual Strength: Reduced ?Motor Speech ?Phonation: Other (comment) (UTA)   ?        ? ?Elvina Sidle, M.S., CCC-SLP ?03/06/2022, 3:02 PM ? ?

## 2022-03-06 NOTE — Progress Notes (Signed)
? ?NAME:  Elizabeth Brock, MRN:  [id number], DOB:  [birth year], LOS: 2 ?ADMISSION DATE:  03/15/2022, CONSULTATION DATE:  02/20/2022 ?REFERRING MD:  Dr Alferd Patee, CHIEF COMPLAINT:  Stroke ? ?History of Present Illness:  ?88 yo female admitted with L MCA stroke, s/p TNK and thrombectomy ? ?Patient presented to the ED with complaints of right sided weakness associated with left gaze preference and right hemianopsia.  NIHSS: 24.  Stroke protocol initiated, neurology consulted, patient was found to have L M1 occlusion.  Patient received TNK and then proceeded with thrombectomy with a total of 3 passes. TICI 2B. Post procedure CTH with contrast staining.  Right femoral site closed with angio-seal  ? ?Patient evaluated in PACU. Intubated and sedated, on propofol infusion.  PERRL, + cough/gag.  Not following commands. Bilateral DP palpable and warm. No bleeding at femoral site.   ? ?Pertinent  Medical History  ?GERD ?Hypertension ?Hyperlipidemia ?Osteoporosis ?Vitamin D Deficiency ? ?Significant Hospital Events: ?Including procedures, antibiotic start and stop dates in addition to other pertinent events   ?4/14: TNK and thrombectomy. Intubated ?4/15 Waking up, tolerating SBT ? ?Interim History / Subjective:  ?Patient was successfully extubated yesterday ?Remains lethargic, high risk of aspiration endotracheal re-intubation ? ? ?Objective   ?Blood pressure (!) 152/71, pulse 95, temperature 98.7 ?F (37.1 ?C), temperature source Axillary, resp. rate 18, height 5' (1.524 m), weight 53.2 kg, SpO2 96 %. ?   ?Vent Mode: PSV;CPAP ?FiO2 (%):  [40 %] 40 % ?PEEP:  [5 cmH20] 5 cmH20 ?Pressure Support:  [5 cmH20] 5 cmH20  ? ?Intake/Output Summary (Last 24 hours) at 03/06/2022 1139 ?Last data filed at 03/06/2022 1000 ?Gross per 24 hour  ?Intake 1640.87 ml  ?Output 250 ml  ?Net 1390.87 ml  ? ?Filed Weights  ? 02/22/2022 1200  ?Weight: 53.2 kg  ? ?Physical exam: ?General: Acute on chronically ill-appearing female, lying on the bed ?HEENT: [place]/AT,  eyes anicteric.  moist mucus membranes ?Neuro: Opens eyes with vocal stimuli, not following commands, does not follow examiner antigravity on left side, withdrawing in right upper and lower extremity ?Chest: Coarse breath sounds, no wheezes or rhonchi ?Heart: Regular rate and rhythm, no murmurs or gallops ?Abdomen: Soft, nontender, nondistended, bowel sounds present ?Skin: No rash ? ?Resolved Hospital Problem list   ? ? ?Assessment & Plan:  ?Acute left MCA stroke s/p TNK and thrombectomy of left M1 occlusion with stent placement ?Continue neuro watch ?Stroke team is following ?Continue secondary stroke prophylaxis ?Echo showed no acute wall motion abnormalities ?MRI brain is done ?Continue aspirin and Brilinta ?Continue statin ?Goal SBP < 180 and DBP < 110 ? ?Acute respiratory failure with hypoxia ?High risk of aspiration ?Patient was successfully extubated yesterday, she is pooling secretions in the back of the throat, she does have cough and gag reflex, due to her mental status, high risk of aspiration pneumonia ?Continue oxygen via nasal cannula, titrate with O2 sat goal 92% ? ?Hypertension ?Goal SBP < 180 ?Intermittently goes on Cleviprex ?Started on amlodipine ?If blood pressure remains elevated, will start Cozaar ? ?Hyperlipidemia ?Continue statin ? ?GERD ?Continue Protonix ? ?Right leg acute DVT was ruled out ? ?Best Practice (right click and "Reselect all SmartList Selections" daily)  ? ?Diet/type: NPO, speech and swallow evaluation ?DVT prophylaxis: Subcu Lovenox ?GI prophylaxis: PPI ?Lines: N/A ?Foley: Discontinue ?Code Status:  full code ?Last date of multidisciplinary goals of care discussion [4/15, please see plan of care note] ? ?Labs   ?CBC: ?Recent Labs  ?Lab 03/06/2022 ?1221  02/28/2022 ?1226 02/21/2022 ?1951 03/02/2022 ?2013 03/05/22 ?0503 03/06/22 ?1003  ?WBC 6.5  --  7.6  --  7.4 12.9*  ?NEUTROABS 3.8  --   --   --  6.0  --   ?HGB 11.7* 11.9* 11.4* 10.9* 10.4* 8.9*  ?HCT 35.6* 35.0* 34.0* 32.0* 30.0*  26.2*  ?MCV 103.2*  --  100.9*  --  99.7 101.6*  ?PLT 213  --  213  --  235 218  ? ? ?Basic Metabolic Panel: ?Recent Labs  ?Lab 02/25/2022 ?1221 02/25/2022 ?1226 03/14/2022 ?2013 03/05/22 ?0503 03/06/22 ?1003  ?NA 138 140 140 140 140  ?K 3.8 3.8 3.4* 3.7 3.2*  ?CL 108 103  --  113* 112*  ?CO2 23  --   --  21* 20*  ?GLUCOSE 110* 108*  --  155* 99  ?BUN 16 18  --  13 10  ?CREATININE 0.99 1.00  --  0.86 0.89  ?CALCIUM 9.6  --   --  8.8* 8.6*  ? ?GFR: ?Estimated Creatinine Clearance: 32.6 mL/min (by C-G formula based on SCr of 0.89 mg/dL). ?Recent Labs  ?Lab 03/15/2022 ?1221 03/12/2022 ?1951 03/05/22 ?0503 03/06/22 ?1003  ?WBC 6.5 7.6 7.4 12.9*  ? ? ?Liver Function Tests: ?Recent Labs  ?Lab 03/03/2022 ?1221  ?AST 15  ?ALT 10  ?ALKPHOS 69  ?BILITOT 0.6  ?PROT 6.2*  ?ALBUMIN 3.4*  ? ?No results for input(s): LIPASE, AMYLASE in the last 168 hours. ?No results for input(s): AMMONIA in the last 168 hours. ? ?ABG ?   ?Component Value Date/Time  ? PHART 7.417 03/08/2022 2013  ? PCO2ART 34.5 03/11/2022 2013  ? PO2ART 147 (H) 03/11/2022 2013  ? HCO3 22.3 03/15/2022 2013  ? TCO2 23 02/19/2022 2013  ? ACIDBASEDEF 2.0 03/17/2022 2013  ? O2SAT 99 03/12/2022 2013  ?  ? ?Coagulation Profile: ?Recent Labs  ?Lab 03/14/2022 ?1221  ?INR 1.1  ? ? ?Cardiac Enzymes: ?No results for input(s): CKTOTAL, CKMB, CKMBINDEX, TROPONINI in the last 168 hours. ? ?HbA1C: ?Hgb A1c MFr Bld  ?Date/Time Value Ref Range Status  ?03/20/2022 07:51 PM 5.7 (H) 4.8 - 5.6 % Final  ?  Comment:  ?  (NOTE) ?Pre diabetes:          5.7%-6.4% ? ?Diabetes:              >6.4% ? ?Glycemic control for   <7.0% ?adults with diabetes ?  ? ? ?CBG: ?Recent Labs  ?Lab 03/15/2022 ?1221  ?GLUCAP 120*  ? ? ?Critical care time: 36 min ?  ? ? ?Performed by: Jacky Kindle ?  ?Critical care time was exclusive of separately billable procedures and treating other patients. ?  ?Critical care was necessary to treat or prevent imminent or life-threatening deterioration. ?  ?Critical care was time spent  personally by me on the following activities: development of treatment plan with patient and/or surrogate as well as nursing, discussions with consultants, evaluation of patient's response to treatment, examination of patient, obtaining history from patient or surrogate, ordering and performing treatments and interventions, ordering and review of laboratory studies, ordering and review of radiographic studies, pulse oximetry and re-evaluation of patient's condition. ?  ?Jacky Kindle MD ?[facility] ?See Amion for pager ?If no response to pager, please call [phone] until 7pm ?After 7pm, Please call E-link [phone] ? ? ? ?

## 2022-03-06 NOTE — Progress Notes (Signed)
Inpatient Rehab Admissions Coordinator:  ? ?Per therapy recommendations, patient was screened for CIR candidacy by Clemens Catholic, MS, CCC-SLP. At this time, Pt. Not yet demonstrating ability to tolerate the intensity of CIR; however,  Pt. may have potential to progress to becoming a potential CIR candidate, so CIR admissions team will follow and monitor for progress and participation with therapies and place consult order if Pt. appears to be an appropriate candidate. Please contact me with any questions.  ? ?

## 2022-03-06 NOTE — Progress Notes (Signed)
STROKE TEAM PROGRESS NOTE  ? ?SUBJECTIVE (INTERVAL HISTORY) ?Two daughters are at the bedside.  Overall her condition is stable.  She is extubated yesterday after MRI, so far tolerating well. She is able to open eyes on voice, still has left gaze preference , may followed a couple of simple commands but still has right hemiplegia. ? ? ?OBJECTIVE ?Temp:  [98.3 ?F (36.8 ?C)-99.6 ?F (37.6 ?C)] 99.6 ?F (37.6 ?C) (04/16 1200) ?Pulse Rate:  [86-119] 106 (04/16 1300) ?Cardiac Rhythm: Normal sinus rhythm (04/16 0800) ?Resp:  [13-27] 20 (04/16 1300) ?BP: (131-177)/(56-94) 149/86 (04/16 1300) ?SpO2:  [92 %-100 %] 95 % (04/16 1300) ?Arterial Line BP: (121-146)/(48-111) 138/93 (04/15 2000) ?FiO2 (%):  [40 %] 40 % (04/15 1510) ? ?Recent Labs  ?Lab 03/06/2022 ?1221  ?GLUCAP 120*  ? ?Recent Labs  ?Lab 02/21/2022 ?1221 02/19/2022 ?1226 03/20/2022 ?2013 03/05/22 ?0503 03/06/22 ?1003  ?NA 138 140 140 140 140  ?K 3.8 3.8 3.4* 3.7 3.2*  ?CL 108 103  --  113* 112*  ?CO2 23  --   --  21* 20*  ?GLUCOSE 110* 108*  --  155* 99  ?BUN 16 18  --  13 10  ?CREATININE 0.99 1.00  --  0.86 0.89  ?CALCIUM 9.6  --   --  8.8* 8.6*  ? ?Recent Labs  ?Lab 03/07/2022 ?1221  ?AST 15  ?ALT 10  ?ALKPHOS 69  ?BILITOT 0.6  ?PROT 6.2*  ?ALBUMIN 3.4*  ? ?Recent Labs  ?Lab 03/02/2022 ?1221 03/08/2022 ?1226 02/28/2022 ?1951 03/16/2022 ?2013 03/05/22 ?0503 03/06/22 ?1003  ?WBC 6.5  --  7.6  --  7.4 12.9*  ?NEUTROABS 3.8  --   --   --  6.0  --   ?HGB 11.7* 11.9* 11.4* 10.9* 10.4* 8.9*  ?HCT 35.6* 35.0* 34.0* 32.0* 30.0* 26.2*  ?MCV 103.2*  --  100.9*  --  99.7 101.6*  ?PLT 213  --  213  --  235 218  ? ?No results for input(s): CKTOTAL, CKMB, CKMBINDEX, TROPONINI in the last 168 hours. ?Recent Labs  ?  03/02/2022 ?1221  ?LABPROT 13.9  ?INR 1.1  ? ?Recent Labs  ?  03/05/22 ?1017  ?COLORURINE YELLOW  ?LABSPEC 1.044*  ?PHURINE 5.0  ?GLUCOSEU NEGATIVE  ?HGBUR NEGATIVE  ?BILIRUBINUR NEGATIVE  ?KETONESUR NEGATIVE  ?PROTEINUR NEGATIVE  ?NITRITE NEGATIVE  ?LEUKOCYTESUR NEGATIVE  ?  ?    ?Component Value Date/Time  ? CHOL 125 03/05/2022 0503  ? TRIG 101 03/05/2022 0503  ? HDL 45 03/05/2022 0503  ? CHOLHDL 2.8 03/05/2022 0503  ? VLDL 20 03/05/2022 0503  ? [place] 60 03/05/2022 0503  ? ?Lab Results  ?Component Value Date  ? HGBA1C 5.7 (H) 03/18/2022  ? ?   ?Component Value Date/Time  ? LABOPIA NONE DETECTED 03/05/2022 1017  ? COCAINSCRNUR NONE DETECTED 03/05/2022 1017  ? LABBENZ NONE DETECTED 03/05/2022 1017  ? AMPHETMU NONE DETECTED 03/05/2022 1017  ? THCU NONE DETECTED 03/05/2022 1017  ? LABBARB NONE DETECTED 03/05/2022 1017  ?  ?Recent Labs  ?Lab 02/27/2022 ?1221  ?ETH <10  ? ? ?I have personally reviewed the radiological images below and agree with the radiology interpretations. ? ?DG Abd 1 View ? ?Result Date: 03/06/2022 ?CLINICAL DATA:  Nasogastric tube placement. EXAM: ABDOMEN - 1 VIEW COMPARISON:  03/05/2022 FINDINGS: Nasogastric tube remains in place with tip overlying the gastric antrum. No evidence of dilated bowel loops. IMPRESSION: Nasogastric tube tip overlies the gastric antrum. Electronically Signed   By: Myles Rosenthal.D.  On: 03/06/2022 09:10  ? ?DG Abd 1 View ? ?Result Date: 03/05/2022 ?CLINICAL DATA:  OG tube placement EXAM: ABDOMEN - 1 VIEW COMPARISON:  None. FINDINGS: Esophageal tube tip overlies the gastric body. Atelectasis or scar at the left base. Visible gas pattern is unobstructed IMPRESSION: Esophageal tube tip overlies the body of the stomach Electronically Signed   By: Donavan Foil M.D.   On: 03/05/2022 19:53  ? ?MR ANGIO HEAD WO CONTRAST ? ?Result Date: 03/05/2022 ?CLINICAL DATA:  Stroke, follow-up. Status post revascularization of left M1 occlusion. EXAM: MRA HEAD WITHOUT CONTRAST TECHNIQUE: Angiographic images of the Circle of Willis were acquired using MRA technique without intravenous contrast. FINDINGS: Anterior circulation: The right internal carotid artery, A1 segment and M1 segments are normal. Right ACA and MCA branch vessels are normal. The left M1 segment  remains patent with signal to the stent. Artifact noted at the stent. Anterior branch vessels are not seen. Posterior circulation: The left vertebral artery is dominant. Basilar artery is within normal limits. Posterior cerebral arteries originate from the basilar tip. PCA branch vessels are within normal limits. Anatomic variants: None Other: None. IMPRESSION: 1. Patent left M1 segment with signal to the stent. 2. Posterior branches distal to the stent less prominent than on the previous study. 3. Persistent occlusion of anterior branches. 4. Right MCA, bilateral ACA, and posterior circulation within normal limits. Electronically Signed   By: San Morelle M.D.   On: 03/05/2022 15:04  ? ?MR BRAIN WO CONTRAST ? ?Result Date: 03/05/2022 ?CLINICAL DATA:  Neuro deficit, acute, stroke suspected. Sudden onset right-sided weakness, left gaze deviation and right hemianopsia. Patient was found unresponsive. EXAM: MRI HEAD WITHOUT CONTRAST TECHNIQUE: Multiplanar, multiecho pulse sequences of the brain and surrounding structures were obtained without intravenous contrast. COMPARISON:  CT head without contrast 03/11/2022 and CTA head and neck 03/20/2022 FINDINGS: Brain: Restricted diffusion is present with acute nonhemorrhagic infarct involving the left lentiform nucleus, the anterior insular cortex, left corona radiata and scattered areas in the high left frontal and parietal cortex. A single cortical infarct in the anterior right frontal lobe measures 6 mm on image 39 of series 2. A separate posterior MCA territory infarct involves the posterior left parietal and temporal lobe, potentially near the watershed. T2 hyperintensities are associated with these areas of acute/subacute infarction. Diffuse periventricular and subcortical white matter changes are also present bilaterally. White matter changes extend into the brainstem. Remote infarcts are present posterior cerebellum bilaterally without hemorrhage. The  ventricles are of normal size. No significant extraaxial fluid collection is present. The internal auditory canals are within normal limits. Vascular: Flow is present in the major intracranial arteries. Skull and upper cervical spine: The craniocervical junction is normal. Upper cervical spine is within normal limits. Marrow signal is unremarkable. Sinuses/Orbits: Right sphenoid sinus is opacified. The paranasal sinuses and mastoid air cells are otherwise clear. Bilateral lens replacements are noted. Globes and orbits are otherwise unremarkable. IMPRESSION: 1. Acute/subacute nonhemorrhagic infarct involving the left lentiform nucleus, anterior insular cortex, left corona radiata, and scattered areas in the high left frontal and parietal cortex. 2. Additional posterior left MCA territory infarct, potentially near the watershed involving the posterior left parietal lobe and temporal lobe. 3. Single 6 mm cortical infarct in the anterior right frontal lobe. 4. Remote infarcts of the posterior cerebellum bilaterally. 5. Diffuse periventricular and subcortical white matter disease bilaterally likely reflects the sequela of chronic microvascular ischemia. Electronically Signed   By: San Morelle M.D.   On: 03/05/2022  14:58  ? ?DG CHEST PORT 1 VIEW ? ?Result Date: 02/26/2022 ?CLINICAL DATA:  Enteric tube placement. EXAM: PORTABLE CHEST 1 VIEW COMPARISON:  Chest x-ray from same day at 1827 hours. FINDINGS: Enteric tube tip in the stomach. Stable cardiomediastinal silhouette. Minimal bibasilar atelectasis/scarring. Visualized bowel gas pattern is nonobstructive. IMPRESSION: 1. Enteric tube in the stomach. Electronically Signed   By: Titus Dubin M.D.   On: 03/07/2022 21:05  ? ?DG CHEST PORT 1 VIEW ? ?Result Date: 02/28/2022 ?CLINICAL DATA:  Status post intubation. EXAM: PORTABLE CHEST 1 VIEW COMPARISON:  November 01, 2020 FINDINGS: Endotracheal tube is seen with limited visualization of its distal tip secondary to  the presence of an overlying nasogastric tube. The distal tip appears to sit proximally 3.2 cm from the carina. The distal tip of the nasogastric tube extends into the body of the stomach. Diffuse, chronic appearing increased

## 2022-03-06 NOTE — Progress Notes (Signed)
Pharmacy Electrolyte Replacement ? ?Recent Labs: ? ?Recent Labs  ?  03/06/22 ?1003  ?K 3.2*  ?CREATININE 0.89  ? ? ?Low Critical Values (K </= 2.5, Phos </= 1, Mg </= 1) Present: ?None ? ?MD Contacted: Dr Merrily Pew and Dr Roda Shutters  ? ?Plan: KCl 40 meq per tube q4h x2 ? ?Thank you for involving pharmacy in this patient's care. ? ?Loura Back, PharmD, BCPS ?Clinical Pharmacist ?Clinical phone for 03/06/2022 until 3p is [phone] ?03/06/2022 11:49 AM ? ?**Pharmacist phone directory can be found on [email or web address] listed under [facility]** ? ?

## 2022-03-07 ENCOUNTER — Inpatient Hospital Stay (HOSPITAL_COMMUNITY): Payer: Medicare Other

## 2022-03-07 ENCOUNTER — Encounter (HOSPITAL_COMMUNITY): Payer: Self-pay | Admitting: Radiology

## 2022-03-07 DIAGNOSIS — I9589 Other hypotension: Secondary | ICD-10-CM | POA: Diagnosis not present

## 2022-03-07 DIAGNOSIS — I482 Chronic atrial fibrillation, unspecified: Secondary | ICD-10-CM | POA: Diagnosis not present

## 2022-03-07 DIAGNOSIS — Z7189 Other specified counseling: Secondary | ICD-10-CM | POA: Diagnosis not present

## 2022-03-07 DIAGNOSIS — J9601 Acute respiratory failure with hypoxia: Secondary | ICD-10-CM | POA: Diagnosis not present

## 2022-03-07 DIAGNOSIS — Z515 Encounter for palliative care: Secondary | ICD-10-CM | POA: Diagnosis not present

## 2022-03-07 DIAGNOSIS — J9690 Respiratory failure, unspecified, unspecified whether with hypoxia or hypercapnia: Secondary | ICD-10-CM

## 2022-03-07 DIAGNOSIS — I6602 Occlusion and stenosis of left middle cerebral artery: Secondary | ICD-10-CM | POA: Diagnosis not present

## 2022-03-07 DIAGNOSIS — I63512 Cerebral infarction due to unspecified occlusion or stenosis of left middle cerebral artery: Secondary | ICD-10-CM | POA: Diagnosis not present

## 2022-03-07 LAB — CBC
HCT: 27 % — ABNORMAL LOW (ref 36.0–46.0)
Hemoglobin: 9.1 g/dL — ABNORMAL LOW (ref 12.0–15.0)
MCH: 34.3 pg — ABNORMAL HIGH (ref 26.0–34.0)
MCHC: 33.7 g/dL (ref 30.0–36.0)
MCV: 101.9 fL — ABNORMAL HIGH (ref 80.0–100.0)
Platelets: 197 10*3/uL (ref 150–400)
RBC: 2.65 MIL/uL — ABNORMAL LOW (ref 3.87–5.11)
RDW: 14 % (ref 11.5–15.5)
WBC: 12.1 10*3/uL — ABNORMAL HIGH (ref 4.0–10.5)
nRBC: 0 % (ref 0.0–0.2)

## 2022-03-07 LAB — GLUCOSE, CAPILLARY
Glucose-Capillary: 137 mg/dL — ABNORMAL HIGH (ref 70–99)
Glucose-Capillary: 143 mg/dL — ABNORMAL HIGH (ref 70–99)
Glucose-Capillary: 145 mg/dL — ABNORMAL HIGH (ref 70–99)
Glucose-Capillary: 154 mg/dL — ABNORMAL HIGH (ref 70–99)
Glucose-Capillary: 154 mg/dL — ABNORMAL HIGH (ref 70–99)
Glucose-Capillary: 158 mg/dL — ABNORMAL HIGH (ref 70–99)

## 2022-03-07 LAB — POCT I-STAT 7, (LYTES, BLD GAS, ICA,H+H)
Acid-Base Excess: 3 mmol/L — ABNORMAL HIGH (ref 0.0–2.0)
Bicarbonate: 27.3 mmol/L (ref 20.0–28.0)
Calcium, Ion: 1.27 mmol/L (ref 1.15–1.40)
HCT: 26 % — ABNORMAL LOW (ref 36.0–46.0)
Hemoglobin: 8.8 g/dL — ABNORMAL LOW (ref 12.0–15.0)
O2 Saturation: 100 %
Patient temperature: 98.5
Potassium: 3.9 mmol/L (ref 3.5–5.1)
Sodium: 139 mmol/L (ref 135–145)
TCO2: 28 mmol/L (ref 22–32)
pCO2 arterial: 37.7 mmHg (ref 32–48)
pH, Arterial: 7.469 — ABNORMAL HIGH (ref 7.35–7.45)
pO2, Arterial: 449 mmHg — ABNORMAL HIGH (ref 83–108)

## 2022-03-07 LAB — BASIC METABOLIC PANEL
Anion gap: 6 (ref 5–15)
BUN: 13 mg/dL (ref 8–23)
CO2: 24 mmol/L (ref 22–32)
Calcium: 8.8 mg/dL — ABNORMAL LOW (ref 8.9–10.3)
Chloride: 110 mmol/L (ref 98–111)
Creatinine, Ser: 0.69 mg/dL (ref 0.44–1.00)
GFR, Estimated: >60 mL/min (ref >60)
Glucose, Bld: 175 mg/dL — ABNORMAL HIGH (ref 70–99)
Potassium: 4 mmol/L (ref 3.5–5.1)
Sodium: 140 mmol/L (ref 135–145)

## 2022-03-07 LAB — PHOSPHORUS
Phosphorus: 2.3 mg/dL — ABNORMAL LOW (ref 2.5–4.6)
Phosphorus: 2.6 mg/dL (ref 2.5–4.6)

## 2022-03-07 LAB — MAGNESIUM
Magnesium: 1.7 mg/dL (ref 1.7–2.4)
Magnesium: 1.7 mg/dL (ref 1.7–2.4)

## 2022-03-07 MED ORDER — ORAL CARE MOUTH RINSE
15.0000 mL | OROMUCOSAL | Status: DC
  Administered 2022-03-07 – 2022-03-09 (×18): 15 mL via OROMUCOSAL

## 2022-03-07 MED ORDER — POLYETHYLENE GLYCOL 3350 17 G PO PACK: 17.0000 g | PACK | Freq: Every day | ORAL | Status: DC

## 2022-03-07 MED ORDER — SODIUM CHLORIDE 0.9 % IV SOLN
3.0000 g | Freq: Two times a day (BID) | INTRAVENOUS | Status: DC
  Administered 2022-03-07 (×2): 3 g via INTRAVENOUS
  Filled 2022-03-07 (×2): qty 8

## 2022-03-07 MED ORDER — PROSOURCE TF PO LIQD
45.0000 mL | Freq: Every day | ORAL | Status: DC
  Administered 2022-03-08 – 2022-03-09 (×2): 45 mL
  Filled 2022-03-07 (×2): qty 45

## 2022-03-07 MED ORDER — ROCURONIUM BROMIDE 50 MG/5ML IV SOLN
50.0000 mg | Freq: Once | INTRAVENOUS | Status: AC
  Administered 2022-03-07: 50 mg via INTRAVENOUS
  Filled 2022-03-07: qty 5

## 2022-03-07 MED ORDER — AMIODARONE HCL IN DEXTROSE 360-4.14 MG/200ML-% IV SOLN
60.0000 mg/h | INTRAVENOUS | Status: AC
  Administered 2022-03-07: 60 mg/h via INTRAVENOUS
  Filled 2022-03-07: qty 200

## 2022-03-07 MED ORDER — DEXMEDETOMIDINE HCL IN NACL 400 MCG/100ML IV SOLN
0.4000 ug/kg/h | INTRAVENOUS | Status: DC
  Administered 2022-03-07: 0.4 ug/kg/h via INTRAVENOUS

## 2022-03-07 MED ORDER — POLYETHYLENE GLYCOL 3350 17 G PO PACK
17.0000 g | PACK | Freq: Every day | ORAL | Status: DC
  Administered 2022-03-08 – 2022-03-09 (×2): 17 g
  Filled 2022-03-07 (×3): qty 1

## 2022-03-07 MED ORDER — PROPOFOL 1000 MG/100ML IV EMUL: 0.0000 ug/kg/min | INTRAVENOUS | Status: DC

## 2022-03-07 MED ORDER — FENTANYL CITRATE PF 50 MCG/ML IJ SOSY: 25.0000 ug | PREFILLED_SYRINGE | INTRAMUSCULAR | Status: DC | PRN

## 2022-03-07 MED ORDER — LACTATED RINGERS IV BOLUS
1000.0000 mL | Freq: Once | INTRAVENOUS | Status: AC
  Administered 2022-03-07: 1000 mL via INTRAVENOUS

## 2022-03-07 MED ORDER — AMIODARONE HCL IN DEXTROSE 360-4.14 MG/200ML-% IV SOLN
30.0000 mg/h | INTRAVENOUS | Status: DC
  Administered 2022-03-07 – 2022-03-08 (×2): 30 mg/h via INTRAVENOUS
  Filled 2022-03-07 (×2): qty 200

## 2022-03-07 MED ORDER — PHENYLEPHRINE HCL-NACL 20-0.9 MG/250ML-% IV SOLN
25.0000 ug/min | INTRAVENOUS | Status: DC
  Administered 2022-03-07 (×2): 25 ug/min via INTRAVENOUS
  Filled 2022-03-07: qty 250

## 2022-03-07 MED ORDER — ETOMIDATE 2 MG/ML IV SOLN
INTRAVENOUS | Status: AC
  Filled 2022-03-07: qty 20

## 2022-03-07 MED ORDER — DEXMEDETOMIDINE HCL IN NACL 400 MCG/100ML IV SOLN
0.0000 ug/kg/h | INTRAVENOUS | Status: DC
  Administered 2022-03-07: 0.6 ug/kg/h via INTRAVENOUS
  Administered 2022-03-07: 0.4 ug/kg/h via INTRAVENOUS
  Administered 2022-03-08: 0.8 ug/kg/h via INTRAVENOUS
  Administered 2022-03-08: 0.4 ug/kg/h via INTRAVENOUS
  Filled 2022-03-07 (×4): qty 100

## 2022-03-07 MED ORDER — AMIODARONE IV BOLUS ONLY 150 MG/100ML
150.0000 mg | Freq: Once | INTRAVENOUS | Status: AC
  Administered 2022-03-07: 150 mg via INTRAVENOUS
  Filled 2022-03-07: qty 100

## 2022-03-07 MED ORDER — ROCURONIUM BROMIDE 10 MG/ML (PF) SYRINGE
PREFILLED_SYRINGE | INTRAVENOUS | Status: AC
  Filled 2022-03-07: qty 10

## 2022-03-07 MED ORDER — SODIUM CHLORIDE 0.9 % IV SOLN
250.0000 mL | INTRAVENOUS | Status: DC
  Administered 2022-03-07: 250 mL via INTRAVENOUS

## 2022-03-07 MED ORDER — OSMOLITE 1.2 CAL PO LIQD
1000.0000 mL | ORAL | Status: DC
Start: 2022-03-07 — End: 2022-03-09
  Administered 2022-03-07 – 2022-03-08 (×2): 1000 mL

## 2022-03-07 MED ORDER — METOPROLOL TARTRATE 5 MG/5ML IV SOLN
5.0000 mg | Freq: Once | INTRAVENOUS | Status: AC
  Administered 2022-03-07: 5 mg via INTRAVENOUS

## 2022-03-07 MED ORDER — DOCUSATE SODIUM 50 MG/5ML PO LIQD: 100.0000 mg | Freq: Two times a day (BID) | ORAL | Status: DC

## 2022-03-07 MED ORDER — ETOMIDATE 2 MG/ML IV SOLN
20.0000 mg | Freq: Once | INTRAVENOUS | Status: AC
  Administered 2022-03-07: 20 mg via INTRAVENOUS

## 2022-03-07 MED ORDER — DOCUSATE SODIUM 50 MG/5ML PO LIQD
100.0000 mg | Freq: Two times a day (BID) | ORAL | Status: DC
  Administered 2022-03-07 – 2022-03-09 (×5): 100 mg
  Filled 2022-03-07 (×5): qty 10

## 2022-03-07 MED ORDER — SODIUM PHOSPHATES 45 MMOLE/15ML IV SOLN
10.0000 mmol | Freq: Once | INTRAVENOUS | Status: AC
  Administered 2022-03-07: 10 mmol via INTRAVENOUS
  Filled 2022-03-07: qty 3.33

## 2022-03-07 MED ORDER — FENTANYL CITRATE PF 50 MCG/ML IJ SOSY
PREFILLED_SYRINGE | INTRAMUSCULAR | Status: AC
  Filled 2022-03-07: qty 2

## 2022-03-07 MED ORDER — METOPROLOL TARTRATE 5 MG/5ML IV SOLN
INTRAVENOUS | Status: AC
  Filled 2022-03-07: qty 5

## 2022-03-07 MED ORDER — PHENYLEPHRINE HCL-NACL 20-0.9 MG/250ML-% IV SOLN
INTRAVENOUS | Status: AC
  Filled 2022-03-07: qty 250

## 2022-03-07 MED ORDER — MIDAZOLAM HCL 2 MG/2ML IJ SOLN
INTRAMUSCULAR | Status: AC
  Filled 2022-03-07: qty 2

## 2022-03-07 NOTE — Consult Note (Signed)
? ?                                                                                ?Consultation Note ?Date: 03/07/2022  ? ?Patient Name: Elizabeth Brock  ?DOB: 30-Jun-1933  MRN: [id number]  Age / Sex: 88 y.o., female  ?PCP: Vernie Shanks, MD ?Referring Physician: Stroke, Md, MD ? ?Reason for Consultation:  goals of care ? ?HPI/Patient Profile: 88 y.o. female  with past medical history of GERD, HTN, HLD, vit d deficiency admitted on 03/12/2022 with stroke- received TNK and had thrombectomy with stent placement. Extubated on 4/15. She remained lethargic after extubation. On 4/17 went into respiratory failure- probably aspiration pneumonia and required reintubation. She is having A fib with RVR. Palliative consulted for [place].  ? ?Primary Decision Maker ?NEXT OF KIN- spouse ? ?Discussion: ?Chart reviewed including progress notes, labs, imaging. Discussed with attending providers.  ?Evaluated patient- unresponsive on ventilator support.  ?Spoke with patient's daughter- introduced Palliative medicine.  ?Prior to this admission patient was living independently with spouse. They have been married over 73 years.  ?Family is struggling with decisions regarding ongoing aggressive medical care, code status. Elizabeth Brock (patient's daughter) shares that her father is not ready to let their mother go.  ?There are other family members en route who have been delayed by traffic. Elizabeth Brock would like to wait for their arrival for further [place] discussion. Also would like her father to be more rested.  ?We discussed code status and the possibility that her mother may worsen overnight- Elizabeth Brock would like to be called if her mother's status changes- but currently requests full resuscitative efforts.  ?  ? ?SUMMARY OF RECOMMENDATIONS ?-Large CVA with residual poor mental status, now with respiratory failure possibly due to aspiration event  ?-Continue current plan of care- plan to meet with family tomorrow morning at 10 am for further [place]  discussion ? ?Code Status/Advance Care Planning: ?Full code ? ? ?Prognosis:   ?Unable to determine ? ?Discharge Planning: To Be Determined ? ?Primary Diagnoses: ?Present on Admission: ? Acute ischemic left MCA stroke ([place]) ? Middle cerebral artery embolism, left ? ? ?Review of Systems ? ?Physical Exam ? ?Vital Signs: BP 108/69   Pulse 65   Temp (!) 97.1 ?F (36.2 ?C) (Axillary)   Resp (!) 22   Ht 5' (1.524 m)   Wt 54 kg   SpO2 100%   BMI 23.25 kg/m?  ?Pain Scale: CPOT ?  ?Pain Score: Asleep ? ? ?SpO2: SpO2: 100 % ?O2 Device:SpO2: 100 % ?O2 Flow Rate: .O2 Flow Rate (L/min): 2 L/min ? ?IO: Intake/output summary:  ?Intake/Output Summary (Last 24 hours) at 03/07/2022 1533 ?Last data filed at 03/07/2022 1300 ?Gross per 24 hour  ?Intake 1926.28 ml  ?Output 1700 ml  ?Net 226.28 ml  ? ? ?LBM: Last BM Date :  (PTA) ?Baseline Weight: Weight: 53.2 kg ?Most recent weight: Weight: 54 kg     ? ? ? ?Thank you for this consult. Palliative medicine will continue to follow and assist as needed.  ? ? ?Signed by: ?Mariana Kaufman, AGNP-C ?Palliative Medicine ? ?  ?Please contact Palliative Medicine Team phone at [phone] for questions and  concerns.  ?For individual provider: See Amion ? ? ? ? ? ? ? ? ? ? ? ? ? ? ?

## 2022-03-07 NOTE — Progress Notes (Signed)
Pharmacy Antibiotic Note ? ?Elizabeth Brock is a 88 y.o. female admitted on 02/20/2022 presenting as code stroke, re-intubated and concern for aspiration.  Pharmacy has been consulted for Unasyn dosing. ? ?Plan: ?Unasyn 3g IV q 12 hours ?Monitor renal function, clinical progression and LOT ? ?Height: 5' (152.4 cm) ?Weight: 54 kg (119 lb 0.8 oz) ?IBW/kg (Calculated) : 45.5 ? ?Temp (24hrs), Avg:98.6 ?F (37 ?C), Min:97.8 ?F (36.6 ?C), Max:99.6 ?F (37.6 ?C) ? ?Recent Labs  ?Lab 02/25/2022 ?1221 03/05/2022 ?1226 02/20/2022 ?1951 03/05/22 ?0503 03/06/22 ?1003  ?WBC 6.5  --  7.6 7.4 12.9*  ?CREATININE 0.99 1.00  --  0.86 0.89  ?  ?Estimated Creatinine Clearance: 32.6 mL/min (by C-G formula based on SCr of 0.89 mg/dL).   ? ?Allergies  ?Allergen Reactions  ? Aspirin Nausea And Vomiting  ?  Uncoated aspirin  ? Cortisone Hives  ?  Hives around neck.  ? Penicillins Hives  ?  Hives around ears.  ? ? ?Bertis Ruddy, PharmD ?Clinical Pharmacist ?ED Pharmacist Phone # [phone] ?03/07/2022 9:39 AM ? ?

## 2022-03-07 NOTE — Progress Notes (Signed)
? ? ?Referring Physician(s): ?Dr Erlinda Hong ? ?Supervising Physician: Luanne Bras ? ?Patient Status:  [facility] - In-pt ? ?Chief Complaint: ? ?CVA ?L MCA angioplasty/stent - NIR 02/21/2022 ? ?Subjective: ?Patient unfortunately re-intubated.  ?Elevated HR/a fib with RVR, poor airway protection ?Family at bedside.  ? ?Allergies: ?Aspirin, Cortisone, and Penicillins ? ?Medications: ?Prior to Admission medications   ?Medication Sig Start Date End Date Taking? Authorizing Provider  ?amLODipine (NORVASC) 5 MG tablet Take 5 mg by mouth 2 (two) times daily. 12/20/21  Yes [provider]  ?bisoprolol-hydrochlorothiazide (ZIAC) 10-6.25 MG per tablet Take 1 tablet by mouth 2 (two) times daily.   Yes [provider]  ?cholecalciferol (VITAMIN D) 25 MCG (1000 UNIT) tablet Take 2,000 Units by mouth daily.   Yes [provider]  ?LORazepam (ATIVAN) 0.5 MG tablet Take 0.5 mg by mouth at bedtime as needed for anxiety or sleep.   Yes [provider]  ?losartan (COZAAR) 100 MG tablet Take 100 mg by mouth daily.   Yes [provider]  ?pantoprazole (PROTONIX) 40 MG tablet Take 40 mg by mouth daily as needed (GERD).   Yes [provider]  ?sertraline (ZOLOFT) 100 MG tablet Take 50 mg by mouth daily.   Yes [provider]  ?simvastatin (ZOCOR) 20 MG tablet Take 20 mg by mouth daily.   Yes [provider]  ?vitamin B-12 (CYANOCOBALAMIN) 100 MCG tablet Take 100 mcg by mouth daily.   Yes [provider]  ?ALPRAZolam (XANAX) 0.25 MG tablet Take 0.25 mg by mouth 2 (two) times daily as needed for anxiety (anxiety). ?Patient not taking: Reported on 03/05/2022    [provider]  ? ? ? ?Vital Signs: ?BP (!) 146/106   Pulse (!) 104   Temp 98.5 ?F (36.9 ?C) (Axillary)   Resp (!) 22   Ht 5' (1.524 m)   Wt 119 lb 0.8 oz (54 kg)   SpO2 100%   BMI 23.25 kg/m?  ? ?Intubated, sedated ?Non-responsive ?Groin site stable, no evidence of hematoma or pseudoaneurysm.   ? ?Imaging: ?DG Abd 1 View ? ?Result Date: 03/06/2022 ?CLINICAL DATA:  Nasogastric tube placement. EXAM: ABDOMEN - 1 VIEW COMPARISON:  03/05/2022 FINDINGS: Nasogastric tube remains in place with tip overlying the gastric antrum. No evidence of dilated bowel loops. IMPRESSION: Nasogastric tube tip overlies the gastric antrum. Electronically Signed   By: Marlaine Hind M.D.   On: 03/06/2022 09:10  ? ?DG Abd 1 View ? ?Result Date: 03/05/2022 ?CLINICAL DATA:  OG tube placement EXAM: ABDOMEN - 1 VIEW COMPARISON:  None. FINDINGS: Esophageal tube tip overlies the gastric body. Atelectasis or scar at the left base. Visible gas pattern is unobstructed IMPRESSION: Esophageal tube tip overlies the body of the stomach Electronically Signed   By: Donavan Foil M.D.   On: 03/05/2022 19:53  ? ?MR ANGIO HEAD WO CONTRAST ? ?Result Date: 03/05/2022 ?CLINICAL DATA:  Stroke, follow-up. Status post revascularization of left M1 occlusion. EXAM: MRA HEAD WITHOUT CONTRAST TECHNIQUE: Angiographic images of the Circle of Willis were acquired using MRA technique without intravenous contrast. FINDINGS: Anterior circulation: The right internal carotid artery, A1 segment and M1 segments are normal. Right ACA and MCA branch vessels are normal. The left M1 segment remains patent with signal to the stent. Artifact noted at the stent. Anterior branch vessels are not seen. Posterior circulation: The left vertebral artery is dominant. Basilar artery is within normal limits. Posterior cerebral arteries originate from the basilar tip. PCA branch vessels are within  normal limits. Anatomic variants: None Other: None. IMPRESSION: 1. Patent left M1 segment with signal to the stent. 2. Posterior branches distal to the stent less prominent than on the previous study. 3. Persistent occlusion of anterior branches. 4. Right MCA, bilateral ACA, and posterior circulation within normal limits. Electronically Signed   By: San Morelle M.D.   On: 03/05/2022 15:04   ? ?MR BRAIN WO CONTRAST ? ?Result Date: 03/05/2022 ?CLINICAL DATA:  Neuro deficit, acute, stroke suspected. Sudden onset right-sided weakness, left gaze deviation and right hemianopsia. Patient was found unresponsive. EXAM: MRI HEAD WITHOUT CONTRAST TECHNIQUE: Multiplanar, multiecho pulse sequences of the brain and surrounding structures were obtained without intravenous contrast. COMPARISON:  CT head without contrast 02/22/2022 and CTA head and neck 03/03/2022 FINDINGS: Brain: Restricted diffusion is present with acute nonhemorrhagic infarct involving the left lentiform nucleus, the anterior insular cortex, left corona radiata and scattered areas in the high left frontal and parietal cortex. A single cortical infarct in the anterior right frontal lobe measures 6 mm on image 39 of series 2. A separate posterior MCA territory infarct involves the posterior left parietal and temporal lobe, potentially near the watershed. T2 hyperintensities are associated with these areas of acute/subacute infarction. Diffuse periventricular and subcortical white matter changes are also present bilaterally. White matter changes extend into the brainstem. Remote infarcts are present posterior cerebellum bilaterally without hemorrhage. The ventricles are of normal size. No significant extraaxial fluid collection is present. The internal auditory canals are within normal limits. Vascular: Flow is present in the major intracranial arteries. Skull and upper cervical spine: The craniocervical junction is normal. Upper cervical spine is within normal limits. Marrow signal is unremarkable. Sinuses/Orbits: Right sphenoid sinus is opacified. The paranasal sinuses and mastoid air cells are otherwise clear. Bilateral lens replacements are noted. Globes and orbits are otherwise unremarkable. IMPRESSION: 1. Acute/subacute nonhemorrhagic infarct involving the left lentiform nucleus, anterior insular cortex, left corona radiata, and scattered areas  in the high left frontal and parietal cortex. 2. Additional posterior left MCA territory infarct, potentially near the watershed involving the posterior left parietal lobe and temporal lobe. 3. Single 6 mm cortical infarct in the anterior right frontal lobe. 4. Remote infarcts of the posterior cerebellum bilaterally. 5. Diffuse periventricular and subcortical white matter disease bilaterally likely reflects the sequela of chronic microvascular ischemia. Electronically Signed   By: San Morelle M.D.   On: 03/05/2022 14:58  ? ?DG CHEST PORT 1 VIEW ? ?Result Date: 03/07/2022 ?CLINICAL DATA:  Encounter for intubation. EXAM: PORTABLE CHEST 1 VIEW COMPARISON:  AP chest 03/06/2022 FINDINGS: Endotracheal tube tip terminates approximately 5.2 cm above the carina just inferior to the clavicular heads. Enteric tube descends below the diaphragm with the tip excluded by collimation. Cardiac silhouette and mediastinal contours are within normal limits. Mild calcification within aortic arch. Chronic appearing bilateral lower lung interstitial thickening is unchanged. No pleural effusion or pneumothorax. 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 IMPRESSION: Endotracheal tube in appropriate position. No acute lung process. Electronically Signed   By: Yvonne Kendall M.D.   On: 03/07/2022 08:13  ? ?DG CHEST PORT 1 VIEW ? ?Result Date: 03/18/2022 ?CLINICAL DATA:  Enteric tube placement. EXAM: PORTABLE CHEST 1 VIEW COMPARISON:  Chest x-ray from same day at 1827 hours. FINDINGS: Enteric tube tip in the stomach. Stable cardiomediastinal silhouette. Minimal bibasilar atelectasis/scarring. Visualized bowel gas pattern is nonobstructive. IMPRESSION: 1. Enteric tube in the stomach. Electronically Signed   By: Titus Dubin M.D.   On: 03/14/2022  21:05  ? ?DG CHEST PORT 1 VIEW ? ?Result Date: 03/07/2022 ?CLINICAL DATA:  Status post intubation. EXAM: PORTABLE CHEST 1 VIEW COMPARISON:  November 01, 2020 FINDINGS:  Endotracheal tube is seen with limited visualization of its distal tip secondary to the presence of an overlying nasogastric tube. The distal tip appears to sit proximally 3.2 cm from the carina. The distal tip of t

## 2022-03-07 NOTE — Progress Notes (Signed)
PT Cancellation Note ? ?Patient Details ?Name: Elizabeth Brock ?MRN: [id number] ?DOB: February 24, 1933 ? ? ?Cancelled Treatment:    Reason Eval/Treat Not Completed: Medical issues which prohibited therapy- Pt re-intubated. PT to follow up acutely as able.  ? ? ?Lorie Apley, SPT ?Acute Rehab Services ? ? ? ?Lorie Apley ?03/07/2022, 1:12 PM ?

## 2022-03-07 NOTE — Progress Notes (Signed)
Initial Nutrition Assessment ? ?DOCUMENTATION CODES:  ? ?Non-severe (moderate) malnutrition in context of chronic illness ? ?INTERVENTION:  ? ?Tube feeding via NG tube: ?- Change to Osmolite 1.2 @ 50 ml/hr (1200 ml/day) ?- ProSource TF 45 ml daily ? ?Tube feeding regimen provides 1480 kcal, 78 grams of protein, and 984 ml of H2O.  ? ?NUTRITION DIAGNOSIS:  ? ?Moderate Malnutrition related to chronic illness as evidenced by moderate fat depletion, severe muscle depletion. ? ?GOAL:  ? ?Patient will meet greater than or equal to 90% of their needs ? ?MONITOR:  ? ?Vent status, Labs, Weight trends, TF tolerance, I & O's ? ?REASON FOR ASSESSMENT:  ? ?Ventilator, Consult ?Enteral/tube feeding initiation and management ? ?ASSESSMENT:  ? ?88 year old female who presented to the ED on 4/14 with sudden onset right-sided weakness, L gaze deviation, R hemianopsia. PMH of GERD, HTN, HLD, osteoporosis, vitamin D deficiency. Pt found to have L MCA stroke with L M1 occlusion and received TNK. ? ?04/14 - s/p thrombectomy with stent placement ?04/15 - extubated, SLP recommending NPO ?04/16 - SLP recommending NPO, NG tube placed (tip in gastric antrum) ?04/17 - intubated ? ?Noted Palliative Care has been consulted regarding GOC and next steps, including potential for tracheostomy. Discussed pt with RN. Pt had been receiving tube feeds via NG tube prior to intubation this morning. Tube feeds were turned off in anticipation of intubation. Per RN, plan to resume tube feeds. ? ?Consult received for tube feeding initiation and management. Pt with NG tube in place and tip in gastric antrum. ? ?Spoke with pt's daughter and husband at bedside. They report that pt's PO intake has been decreased for about 6 months. For example, pt may have a muffin and coffee for breakfast. For other meals, pt may eat a few bites of each food item on her plate. Pt's family reports that pt lost 30 lbs in a matter of months and that this occurred a few months ago.  They report pt's UBW is 130-140 lbs and she lost down to 100-110 lbs. Pt's family states that after the initial weight loss, she was able to stabilize and eat a little bit more. They do report pt has chronic issues with swallowing and coughing when swallowing. Explained plan to continue tube feeds via NG tube at this time. ? ?Noted phosphorus of 2.3 this morning. RD reached out to Pharmacy for replacement orders. ? ?Current TF: Vital High Protein @ 40 ml/hr, ProSource TF 45 ml BID ? ?Patient is currently intubated on ventilator support ?MV: 7.6 L/min ?Temp (24hrs), Avg:98.2 ?F (36.8 ?C), Min:97.1 ?F (36.2 ?C), Max:98.8 ?F (37.1 ?C) ? ?Drips: ?Precedex ?Amiodarone ?Phenylephrine ? ?Medications reviewed and include: colace, IV protonix, miralax, IV abx ? ?Labs reviewed: phosphorus 2.3, WBC 12.1, hemoglobin 9.1 ?CBG's: 120-154 x 24 hours ? ?UOP: 1700 ml x 24 hours ?I/O's: +1.8 L since admit ? ?NUTRITION - FOCUSED PHYSICAL EXAM: ? ?Flowsheet Row Most Recent Value  ?Orbital Region Severe depletion  ?Upper Arm Region Moderate depletion  ?Thoracic and Lumbar Region Moderate depletion  ?Buccal Region Unable to assess  ?Temple Region Moderate depletion  ?Clavicle Bone Region Severe depletion  ?Clavicle and Acromion Bone Region Severe depletion  ?Scapular Bone Region Moderate depletion  ?Dorsal Hand Moderate depletion  ?Patellar Region Moderate depletion  ?Anterior Thigh Region Severe depletion  ?Posterior Calf Region Moderate depletion  ?Edema (RD Assessment) None  ?Hair Reviewed  ?Eyes Reviewed  ?Mouth Reviewed  ?Skin Reviewed  ?Nails Reviewed  ? ?  ? ? ?  Diet Order:   ?Diet Order   ? ?       ?  Diet NPO time specified  Diet effective now       ?  ? ?  ?  ? ?  ? ? ?EDUCATION NEEDS:  ? ?No education needs have been identified at this time ? ?Skin:  Skin Assessment: Reviewed RN Assessment ? ?Last BM:  no documented BM ? ?Height:  ? ?Ht Readings from Last 1 Encounters:  ?03/07/22 5' (1.524 m)  ? ? ?Weight:  ? ?Wt Readings from  Last 1 Encounters:  ?03/07/22 54 kg  ? ? ?BMI:  Body mass index is 23.25 kg/m?. ? ?Estimated Nutritional Needs:  ? ?Kcal:  1300-1500 ? ?Protein:  65-80 grams ? ?Fluid:  1.3-1.5 L ? ? ? ?Mertie Clause, MS, RD, LDN ?Inpatient Clinical Dietitian ?Please see AMiON for contact information. ? ?

## 2022-03-07 NOTE — Progress Notes (Signed)
OT Cancellation Note ? ?Patient Details ?Name: Elizabeth Brock ?MRN: [id number] ?DOB: [birth year] ? ? ?Cancelled Treatment:    Reason Eval/Treat Not Completed: Medical issues which prohibited therapy (Pt re-intubated, OT evaluation to f/u as appropriate.) ? ?[name] ?03/07/2022, 1:19 PM ?

## 2022-03-07 NOTE — Progress Notes (Signed)
? ?NAME:  Elizabeth Brock, MRN:  [id number], DOB:  May 28, 1933, LOS: 3 ?ADMISSION DATE:  03/12/2022, CONSULTATION DATE:  03/06/2022 ?REFERRING MD:  Dr Alferd Patee, CHIEF COMPLAINT:  Stroke ? ?History of Present Illness:  ?88 yo female admitted with L MCA stroke, s/p TNK and thrombectomy ? ?Patient presented to the ED with complaints of right sided weakness associated with left gaze preference and right hemianopsia.  NIHSS: 24.  Stroke protocol initiated, neurology consulted, patient was found to have L M1 occlusion.  Patient received TNK and then proceeded with thrombectomy with a total of 3 passes. TICI 2B. Post procedure CTH with contrast staining.  Right femoral site closed with angio-seal  ? ?Patient evaluated in PACU. Intubated and sedated, on propofol infusion.  PERRL, + cough/gag.  Not following commands. Bilateral DP palpable and warm. No bleeding at femoral site.   ? ?Pertinent  Medical History  ?GERD ?Hypertension ?Hyperlipidemia ?Osteoporosis ?Vitamin D Deficiency ? ?Significant Hospital Events: ?Including procedures, antibiotic start and stop dates in addition to other pertinent events   ?4/14: TNK and thrombectomy. Intubated ?4/15 Waking up, tolerating SBT ? ?Interim History / Subjective:  ? ?This AM, patient noted to be significantly tachycardia with loud gurgling. Due to inability to protect airway, patient was re-intubated. Amiodarone initiated due to tachycardia non-responsive to Metoprolol.  ? ?Objective   ?Blood pressure (!) 112/104, pulse (!) 153, temperature 98.5 ?F (36.9 ?C), temperature source Axillary, resp. rate (!) 22, height 5' (1.524 m), weight 54 kg, SpO2 100 %. ?   ?Vent Mode: PRVC ?FiO2 (%):  [100 %] 100 % ?Set Rate:  [22 bmp] 22 bmp ?Vt Set:  [360 mL] 360 mL ?PEEP:  [5 cmH20] 5 cmH20 ?Plateau Pressure:  [13 cmH20] 13 cmH20  ? ?Intake/Output Summary (Last 24 hours) at 03/07/2022 0831 ?Last data filed at 03/07/2022 0813 ?Gross per 24 hour  ?Intake 1377.91 ml  ?Output 1700 ml  ?Net -322.09 ml   ? ? ?Filed Weights  ? 03/10/2022 1200 03/07/22 0500  ?Weight: 53.2 kg 54 kg  ? ?Physical exam: ?General: Acute on chronically ill-appearing female, lying on the bed ?HEENT: [place]/AT, eyes anicteric.  moist mucus membranes ?Neuro: Not following commands this AM.  ?Chest: Coarse breath sounds with diminished breathe sounds in the bases. Loud gurgling noted.  ?Heart: Tachycardic with regular rate, no murmurs or gallops ?Abdomen: Soft, nontender, nondistended, bowel sounds present ?Skin: No rash ? ?Resolved Hospital Problem list   ? ? ?Assessment & Plan:  ? ?Acute left MCA stroke s/p TNK and thrombectomy of left M1 occlusion with stent placement ?Right MCA/ACA infarcts ?- Given wide spread nature of infarcts, concerning for cardio-embolic source. Echo completed with no sign of LV thrombus, however there was LA dilation. Telemetry on the AM of 4/17 with significant tachycardia. Irregularity noted. Personally reviewed telemetry and consistent with paroxysmal A. Fib.  ?- Stroke team is following ?- Continue secondary stroke prophylaxis ?- Continue aspirin and Brilinta ?- Continue statin ?- Goal SBP < 180 and DBP < 110 ? ?Acute respiratory failure with hypoxia ?- Secondary to acute neurological deficits with inability to protect airway. Required re-intubation on 4/17.  ?- Continue full vent support ?- Will need goals of care discussion regarding next steps, including tracheostomy. Palliative consulted.  ? ?Paroxysmal Atrial Fibrillation with RVR ?- Amiodarone bolused this AM. Continue Amiodarone infusion with HR goal < 110 ?- CHADs-VASc elevated at 6, however given need for DAPT and high risk for hemorrhagic conversion, will hold off on initiating AC ? ?  Hypertension ?- Continue Amlodipine  ? ?Hyperlipidemia ?- Continue statin ? ?GERD ?- Continue Protonix ? ?Best Practice (right click and "Reselect all SmartList Selections" daily)  ? ?Diet/type: NPO ?DVT prophylaxis: Subcu Lovenox ?GI prophylaxis: PPI ?Lines: N/A ?Foley:  Discontinue ?Code Status:  full code ?Last date of multidisciplinary goals of care discussion [4/17. Palliative consulted.] ? ?Labs   ?CBC: ?Recent Labs  ?Lab 03/10/2022 ?1221 03/12/2022 ?1226 03/01/2022 ?1951 03/01/2022 ?2013 03/05/22 ?0503 03/06/22 ?1003  ?WBC 6.5  --  7.6  --  7.4 12.9*  ?NEUTROABS 3.8  --   --   --  6.0  --   ?HGB 11.7* 11.9* 11.4* 10.9* 10.4* 8.9*  ?HCT 35.6* 35.0* 34.0* 32.0* 30.0* 26.2*  ?MCV 103.2*  --  100.9*  --  99.7 101.6*  ?PLT 213  --  213  --  235 218  ? ? ? ?Basic Metabolic Panel: ?Recent Labs  ?Lab 02/25/2022 ?1221 02/23/2022 ?1226 03/14/2022 ?2013 03/05/22 ?0503 03/06/22 ?1003 03/06/22 ?1527  ?NA 138 140 140 140 140  --   ?K 3.8 3.8 3.4* 3.7 3.2*  --   ?CL 108 103  --  113* 112*  --   ?CO2 23  --   --  21* 20*  --   ?GLUCOSE 110* 108*  --  155* 99  --   ?BUN 16 18  --  13 10  --   ?CREATININE 0.99 1.00  --  0.86 0.89  --   ?CALCIUM 9.6  --   --  8.8* 8.6*  --   ?MG  --   --   --   --   --  1.6*  ?PHOS  --   --   --   --   --  2.7  ? ? ?GFR: ?Estimated Creatinine Clearance: 32.6 mL/min (by C-G formula based on SCr of 0.89 mg/dL). ?Recent Labs  ?Lab 03/11/2022 ?1221 02/20/2022 ?1951 03/05/22 ?0503 03/06/22 ?1003  ?WBC 6.5 7.6 7.4 12.9*  ? ? ? ?Liver Function Tests: ?Recent Labs  ?Lab 03/03/2022 ?1221  ?AST 15  ?ALT 10  ?ALKPHOS 69  ?BILITOT 0.6  ?PROT 6.2*  ?ALBUMIN 3.4*  ? ? ?No results for input(s): LIPASE, AMYLASE in the last 168 hours. ?No results for input(s): AMMONIA in the last 168 hours. ? ?ABG ?   ?Component Value Date/Time  ? PHART 7.417 02/28/2022 2013  ? PCO2ART 34.5 03/12/2022 2013  ? PO2ART 147 (H) 03/19/2022 2013  ? HCO3 22.3 03/14/2022 2013  ? TCO2 23 03/03/2022 2013  ? ACIDBASEDEF 2.0 02/27/2022 2013  ? O2SAT 99 03/07/2022 2013  ? ?  ? ?Coagulation Profile: ?Recent Labs  ?Lab 03/07/2022 ?1221  ?INR 1.1  ? ? ? ?Cardiac Enzymes: ?No results for input(s): CKTOTAL, CKMB, CKMBINDEX, TROPONINI in the last 168 hours. ? ?HbA1C: ?Hgb A1c MFr Bld  ?Date/Time Value Ref Range Status  ?03/18/2022  07:51 PM 5.7 (H) 4.8 - 5.6 % Final  ?  Comment:  ?  (NOTE) ?Pre diabetes:          5.7%-6.4% ? ?Diabetes:              >6.4% ? ?Glycemic control for   <7.0% ?adults with diabetes ?  ? ? ?CBG: ?Recent Labs  ?Lab 03/01/2022 ?1221 03/06/22 ?1810 03/06/22 ?1950 03/07/22 ?0352 03/07/22 ?0809  ?GLUCAP 120* 124* 122* 143* 154*  ? ? ?Dr. Jose Persia ?Internal Medicine PGY-3  ? ?03/07/2022, 9:29 AM ? ?

## 2022-03-07 NOTE — Progress Notes (Signed)
?  Transition of Care (TOC) Screening Note ? ? ?Patient Details  ?Name: Elizabeth Brock ?Date of Birth: 14-Aug-1933 ? ? ?Transition of Care (TOC) CM/SW Contact:    ?Benard Halsted, LCSW ?Phone Number: ?03/07/2022, 9:53 AM ? ? ? ?Transition of Care Department [facility]) has reviewed patient and no TOC needs have been identified at this time. We will continue to monitor patient advancement through interdisciplinary progression rounds. If new patient transition needs arise, please place a TOC consult. ? ? ?

## 2022-03-07 NOTE — Procedures (Signed)
Intubation Procedure Note ? ?Elizabeth Brock  ?[id number]  ?[birth year] ? ?Date:03/07/22  ?Time:7:40 AM  ? ?Provider Performing:Elizabeth Brock  ? ? ?Procedure: Intubation ([id number]) ? ?Indication(s) ?Respiratory Failure ? ?Consent ?Risks of the procedure as well as the alternatives and risks of each were explained to the patient and/or caregiver.  Consent for the procedure was obtained and is signed in the bedside chart ? ? ?Anesthesia ?Etomidate and Rocuronium ? ? ?Time Out ?Verified patient identification, verified procedure, site/side was marked, verified correct patient position, special equipment/implants available, medications/allergies/relevant history reviewed, required imaging and test results available. ? ? ?Sterile Technique ?Usual hand hygeine, masks, and gloves were used ? ? ?Procedure Description ?Patient positioned in bed supine.  Sedation given as noted above.  Patient was intubated with endotracheal tube using Glidescope.  View was Grade 1 full glottis .  Number of attempts was  2 .  Colorimetric CO2 detector was consistent with tracheal placement. ? ? ?Complications/Tolerance ?None; patient tolerated the procedure well. ?Chest X-ray is ordered to verify placement. ? ? ?EBL ?Minimal ? ? ?Specimen(s) ?None ? ?

## 2022-03-07 NOTE — TOC CAGE-AID Note (Signed)
Transition of Care (TOC) - CAGE-AID Screening ? ? ?Patient Details  ?Name: Elizabeth Brock ?MRN: [id number] ?Date of Birth: 1933-08-09 ? ?Transition of Care (TOC) CM/SW Contact:    ?Ember Henrikson C Tarpley-Carter, LCSWA ?Phone Number: ?03/07/2022, 12:34 PM ? ? ?Clinical Narrative: ?Pt is unable to participate in Cage Aid. ?Pt is intubated and non-responsive. ? ?Insurance underwriter, MSW, LCSW-A ?Pronouns:  She/Her/Hers ?[facility]itions of [facility] ?Clinical Social Worker ?Direct Number:  [phone] ?[email or web address] ? ?CAGE-AID Screening: ?Substance Abuse Screening unable to be completed due to: : Patient unable to participate ? ?  ?  ?  ?  ?  ? ?  ? ?  ? ? ? ? ? ? ?

## 2022-03-07 NOTE — Progress Notes (Addendum)
STROKE TEAM PROGRESS NOTE  ? ?SUBJECTIVE (INTERVAL HISTORY) ?Re intubated, afib with rvr vs SVT prior to reintubation. Initially hypertensive post intubation and then hypotensive during exam. ?HR 130-155 with BP 85/62, currently on Amio 33.3 and Precedex  ?Palliative care consult placed per family request.  ? ?OBJECTIVE ?Temp:  [97.8 ?F (36.6 ?C)-99.6 ?F (37.6 ?C)] 98.5 ?F (36.9 ?C) (04/17 0740) ?Pulse Rate:  [86-153] 104 (04/17 1000) ?Cardiac Rhythm: Atrial fibrillation (04/17 0800) ?Resp:  [14-27] 22 (04/17 1000) ?BP: (112-166)/(63-106) 146/106 (04/17 1000) ?SpO2:  [92 %-100 %] 100 % (04/17 1000) ?FiO2 (%):  [40 %-100 %] 40 % (04/17 0856) ?Weight:  [54 kg] 54 kg (04/17 0500) ? ?Recent Labs  ?Lab 02/21/2022 ?1221 03/06/22 ?1810 03/06/22 ?1950 03/07/22 ?0352 03/07/22 ?0809  ?GLUCAP 120* 124* 122* 143* 154*  ? ? ?Recent Labs  ?Lab 03/18/2022 ?1221 03/11/2022 ?1226 03/09/2022 ?2013 03/05/22 ?0503 03/06/22 ?1003 03/06/22 ?1527 03/07/22 ?0856  ?NA 138 140 140 140 140  --  139  ?K 3.8 3.8 3.4* 3.7 3.2*  --  3.9  ?CL 108 103  --  113* 112*  --   --   ?CO2 23  --   --  21* 20*  --   --   ?GLUCOSE 110* 108*  --  155* 99  --   --   ?BUN 16 18  --  13 10  --   --   ?CREATININE 0.99 1.00  --  0.86 0.89  --   --   ?CALCIUM 9.6  --   --  8.8* 8.6*  --   --   ?MG  --   --   --   --   --  1.6*  --   ?PHOS  --   --   --   --   --  2.7  --   ? ? ?Recent Labs  ?Lab 03/07/2022 ?1221  ?AST 15  ?ALT 10  ?ALKPHOS 69  ?BILITOT 0.6  ?PROT 6.2*  ?ALBUMIN 3.4*  ? ? ?Recent Labs  ?Lab 02/28/2022 ?1221 03/14/2022 ?1226 03/09/2022 ?1951 03/02/2022 ?2013 03/05/22 ?0503 03/06/22 ?1003 03/07/22 ?[id number] 03/07/22 ?0919  ?WBC 6.5  --  7.6  --  7.4 12.9*  --  12.1*  ?NEUTROABS 3.8  --   --   --  6.0  --   --   --   ?HGB 11.7*   < > 11.4* 10.9* 10.4* 8.9* 8.8* 9.1*  ?HCT 35.6*   < > 34.0* 32.0* 30.0* 26.2* 26.0* 27.0*  ?MCV 103.2*  --  100.9*  --  99.7 101.6*  --  101.9*  ?PLT 213  --  213  --  235 218  --  197  ? < > = values in this interval not displayed.  ? ? ?No  results for input(s): CKTOTAL, CKMB, CKMBINDEX, TROPONINI in the last 168 hours. ?Recent Labs  ?  03/20/2022 ?1221  ?LABPROT 13.9  ?INR 1.1  ? ? ?Recent Labs  ?  03/05/22 ?1017  ?COLORURINE YELLOW  ?LABSPEC 1.044*  ?PHURINE 5.0  ?GLUCOSEU NEGATIVE  ?HGBUR NEGATIVE  ?BILIRUBINUR NEGATIVE  ?KETONESUR NEGATIVE  ?PROTEINUR NEGATIVE  ?NITRITE NEGATIVE  ?LEUKOCYTESUR NEGATIVE  ? ?  ?   ?Component Value Date/Time  ? CHOL 125 03/05/2022 0503  ? TRIG 101 03/05/2022 0503  ? HDL 45 03/05/2022 0503  ? CHOLHDL 2.8 03/05/2022 0503  ? VLDL 20 03/05/2022 0503  ? [place] 60 03/05/2022 0503  ? ?Lab Results  ?Component Value Date  ?  HGBA1C 5.7 (H) 02/26/2022  ? ?   ?Component Value Date/Time  ? LABOPIA NONE DETECTED 03/05/2022 1017  ? COCAINSCRNUR NONE DETECTED 03/05/2022 1017  ? LABBENZ NONE DETECTED 03/05/2022 1017  ? AMPHETMU NONE DETECTED 03/05/2022 1017  ? THCU NONE DETECTED 03/05/2022 1017  ? LABBARB NONE DETECTED 03/05/2022 1017  ?  ?Recent Labs  ?Lab 03/19/2022 ?1221  ?ETH <10  ? ? ? ?I have personally reviewed the radiological images below and agree with the radiology interpretations. ? ?DG Abd 1 View ? ?Result Date: 03/06/2022 ?CLINICAL DATA:  Nasogastric tube placement. EXAM: ABDOMEN - 1 VIEW COMPARISON:  03/05/2022 FINDINGS: Nasogastric tube remains in place with tip overlying the gastric antrum. No evidence of dilated bowel loops. IMPRESSION: Nasogastric tube tip overlies the gastric antrum. Electronically Signed   By: Marlaine Hind M.D.   On: 03/06/2022 09:10  ? ?DG Abd 1 View ? ?Result Date: 03/05/2022 ?CLINICAL DATA:  OG tube placement EXAM: ABDOMEN - 1 VIEW COMPARISON:  None. FINDINGS: Esophageal tube tip overlies the gastric body. Atelectasis or scar at the left base. Visible gas pattern is unobstructed IMPRESSION: Esophageal tube tip overlies the body of the stomach Electronically Signed   By: Donavan Foil M.D.   On: 03/05/2022 19:53  ? ?MR ANGIO HEAD WO CONTRAST ? ?Result Date: 03/05/2022 ?CLINICAL DATA:  Stroke,  follow-up. Status post revascularization of left M1 occlusion. EXAM: MRA HEAD WITHOUT CONTRAST TECHNIQUE: Angiographic images of the Circle of Willis were acquired using MRA technique without intravenous contrast. FINDINGS: Anterior circulation: The right internal carotid artery, A1 segment and M1 segments are normal. Right ACA and MCA branch vessels are normal. The left M1 segment remains patent with signal to the stent. Artifact noted at the stent. Anterior branch vessels are not seen. Posterior circulation: The left vertebral artery is dominant. Basilar artery is within normal limits. Posterior cerebral arteries originate from the basilar tip. PCA branch vessels are within normal limits. Anatomic variants: None Other: None. IMPRESSION: 1. Patent left M1 segment with signal to the stent. 2. Posterior branches distal to the stent less prominent than on the previous study. 3. Persistent occlusion of anterior branches. 4. Right MCA, bilateral ACA, and posterior circulation within normal limits. Electronically Signed   By: San Morelle M.D.   On: 03/05/2022 15:04  ? ?MR BRAIN WO CONTRAST ? ?Result Date: 03/05/2022 ?CLINICAL DATA:  Neuro deficit, acute, stroke suspected. Sudden onset right-sided weakness, left gaze deviation and right hemianopsia. Patient was found unresponsive. EXAM: MRI HEAD WITHOUT CONTRAST TECHNIQUE: Multiplanar, multiecho pulse sequences of the brain and surrounding structures were obtained without intravenous contrast. COMPARISON:  CT head without contrast 02/22/2022 and CTA head and neck 03/12/2022 FINDINGS: Brain: Restricted diffusion is present with acute nonhemorrhagic infarct involving the left lentiform nucleus, the anterior insular cortex, left corona radiata and scattered areas in the high left frontal and parietal cortex. A single cortical infarct in the anterior right frontal lobe measures 6 mm on image 39 of series 2. A separate posterior MCA territory infarct involves the  posterior left parietal and temporal lobe, potentially near the watershed. T2 hyperintensities are associated with these areas of acute/subacute infarction. Diffuse periventricular and subcortical white matter changes are also present bilaterally. White matter changes extend into the brainstem. Remote infarcts are present posterior cerebellum bilaterally without hemorrhage. The ventricles are of normal size. No significant extraaxial fluid collection is present. The internal auditory canals are within normal limits. Vascular: Flow is present in the major intracranial arteries. Skull  and upper cervical spine: The craniocervical junction is normal. Upper cervical spine is within normal limits. Marrow signal is unremarkable. Sinuses/Orbits: Right sphenoid sinus is opacified. The paranasal sinuses and mastoid air cells are otherwise clear. Bilateral lens replacements are noted. Globes and orbits are otherwise unremarkable. IMPRESSION: 1. Acute/subacute nonhemorrhagic infarct involving the left lentiform nucleus, anterior insular cortex, left corona radiata, and scattered areas in the high left frontal and parietal cortex. 2. Additional posterior left MCA territory infarct, potentially near the watershed involving the posterior left parietal lobe and temporal lobe. 3. Single 6 mm cortical infarct in the anterior right frontal lobe. 4. Remote infarcts of the posterior cerebellum bilaterally. 5. Diffuse periventricular and subcortical white matter disease bilaterally likely reflects the sequela of chronic microvascular ischemia. Electronically Signed   By: San Morelle M.D.   On: 03/05/2022 14:58  ? ?DG CHEST PORT 1 VIEW ? ?Result Date: 03/07/2022 ?CLINICAL DATA:  Encounter for intubation. EXAM: PORTABLE CHEST 1 VIEW COMPARISON:  AP chest 03/17/2022 FINDINGS: Endotracheal tube tip terminates approximately 5.2 cm above the carina just inferior to the clavicular heads. Enteric tube descends below the diaphragm with  the tip excluded by collimation. Cardiac silhouette and mediastinal contours are within normal limits. Mild calcification within aortic arch. Chronic appearing bilateral lower lung interstitial thickening is unchanged.

## 2022-03-07 NOTE — Progress Notes (Signed)
SLP Cancellation Note ? ?Patient Details ?Name: Elizabeth Brock ?MRN: [id number] ?DOB: 1933-03-24 ? ? ?Cancelled treatment:       Reason Eval/Treat Not Completed: Patient not medically ready (Pt intubated this morning. SLP will follow up on subsequent date.) ? ?Goldman Birchall I. Vear Clock, MS, CCC-SLP ?Acute Rehabilitation Services ?Office number [phone] ?Pager [phone] ? ?Scheryl Marten ?03/07/2022, 9:18 AM ?

## 2022-03-08 DIAGNOSIS — Z9911 Dependence on respirator [ventilator] status: Secondary | ICD-10-CM | POA: Diagnosis not present

## 2022-03-08 DIAGNOSIS — I63512 Cerebral infarction due to unspecified occlusion or stenosis of left middle cerebral artery: Secondary | ICD-10-CM | POA: Diagnosis not present

## 2022-03-08 LAB — GLUCOSE, CAPILLARY
Glucose-Capillary: 115 mg/dL — ABNORMAL HIGH (ref 70–99)
Glucose-Capillary: 128 mg/dL — ABNORMAL HIGH (ref 70–99)
Glucose-Capillary: 134 mg/dL — ABNORMAL HIGH (ref 70–99)
Glucose-Capillary: 136 mg/dL — ABNORMAL HIGH (ref 70–99)
Glucose-Capillary: 161 mg/dL — ABNORMAL HIGH (ref 70–99)
Glucose-Capillary: 176 mg/dL — ABNORMAL HIGH (ref 70–99)

## 2022-03-08 LAB — CBC
HCT: 25.6 % — ABNORMAL LOW (ref 36.0–46.0)
Hemoglobin: 8.4 g/dL — ABNORMAL LOW (ref 12.0–15.0)
MCH: 33.6 pg (ref 26.0–34.0)
MCHC: 32.8 g/dL (ref 30.0–36.0)
MCV: 102.4 fL — ABNORMAL HIGH (ref 80.0–100.0)
Platelets: 186 10*3/uL (ref 150–400)
RBC: 2.5 MIL/uL — ABNORMAL LOW (ref 3.87–5.11)
RDW: 13.8 % (ref 11.5–15.5)
WBC: 10 10*3/uL (ref 4.0–10.5)
nRBC: 0 % (ref 0.0–0.2)

## 2022-03-08 LAB — BASIC METABOLIC PANEL
Anion gap: 5 (ref 5–15)
BUN: 15 mg/dL (ref 8–23)
CO2: 26 mmol/L (ref 22–32)
Calcium: 8.5 mg/dL — ABNORMAL LOW (ref 8.9–10.3)
Chloride: 108 mmol/L (ref 98–111)
Creatinine, Ser: 0.67 mg/dL (ref 0.44–1.00)
GFR, Estimated: >60 mL/min (ref >60)
Glucose, Bld: 179 mg/dL — ABNORMAL HIGH (ref 70–99)
Potassium: 3.4 mmol/L — ABNORMAL LOW (ref 3.5–5.1)
Sodium: 139 mmol/L (ref 135–145)

## 2022-03-08 LAB — MAGNESIUM: Magnesium: 1.7 mg/dL (ref 1.7–2.4)

## 2022-03-08 LAB — PHOSPHORUS: Phosphorus: 2.6 mg/dL (ref 2.5–4.6)

## 2022-03-08 LAB — VITAMIN B12: Vitamin B-12: 2562 pg/mL — ABNORMAL HIGH (ref 180–914)

## 2022-03-08 LAB — TRIGLYCERIDES: Triglycerides: 46 mg/dL (ref <150)

## 2022-03-08 MED ORDER — SODIUM CHLORIDE 0.9 % IV SOLN
3.0000 g | Freq: Three times a day (TID) | INTRAVENOUS | Status: DC
  Administered 2022-03-08: 3 g via INTRAVENOUS
  Filled 2022-03-08: qty 8

## 2022-03-08 MED ORDER — ENOXAPARIN SODIUM 40 MG/0.4ML IJ SOSY
40.0000 mg | PREFILLED_SYRINGE | INTRAMUSCULAR | Status: DC
  Administered 2022-03-09: 40 mg via SUBCUTANEOUS

## 2022-03-08 MED ORDER — AMIODARONE HCL 200 MG PO TABS
200.0000 mg | ORAL_TABLET | Freq: Two times a day (BID) | ORAL | Status: DC
  Administered 2022-03-08 – 2022-03-09 (×3): 200 mg via NASOGASTRIC
  Filled 2022-03-08 (×3): qty 1

## 2022-03-08 MED ORDER — POTASSIUM CHLORIDE 10 MEQ/100ML IV SOLN
10.0000 meq | INTRAVENOUS | Status: AC
  Administered 2022-03-08 (×2): 10 meq via INTRAVENOUS
  Filled 2022-03-08 (×2): qty 100

## 2022-03-08 NOTE — Progress Notes (Signed)
? ?NAME:  Elizabeth Brock, MRN:  [id number], DOB:  04-Sep-1933, LOS: 4 ?ADMISSION DATE:  03/12/2022, CONSULTATION DATE:  03/12/2022 ?REFERRING MD:  Dr Alferd Patee, CHIEF COMPLAINT:  Stroke ? ?History of Present Illness:  ?88 yo female admitted with L MCA stroke, s/p TNK and thrombectomy ? ?Patient presented to the ED with complaints of right sided weakness associated with left gaze preference and right hemianopsia.  NIHSS: 24.  Stroke protocol initiated, neurology consulted, patient was found to have L M1 occlusion.  Patient received TNK and then proceeded with thrombectomy with a total of 3 passes. TICI 2B. Post procedure CTH with contrast staining.  Right femoral site closed with angio-seal  ? ?Patient evaluated in PACU. Intubated and sedated, on propofol infusion.  PERRL, + cough/gag.  Not following commands. Bilateral DP palpable and warm. No bleeding at femoral site.   ? ?Pertinent  Medical History  ?GERD ?Hypertension ?Hyperlipidemia ?Osteoporosis ?Vitamin D Deficiency ? ?Significant Hospital Events: ?Including procedures, antibiotic start and stop dates in addition to other pertinent events   ?4/14: TNK and thrombectomy. Intubated ?4/15 Waking up, tolerating SBT ?4/16: Extubated to [place] ?4/17: Re-intubated for airway protection. Palliative Care consulted.  ? ?Interim History / Subjective:  ? ?Phenylephrine weaned off overnight. HR stable between 6-70. No additional episodes of RVR.  ?No acute overnight events.  ? ?Objective   ?Blood pressure (!) 108/52, pulse (!) 59, temperature 97.9 ?F (36.6 ?C), temperature source Axillary, resp. rate (!) 22, height 5' (1.524 m), weight 53.8 kg, SpO2 98 %. ?   ?Vent Mode: PRVC ?FiO2 (%):  [30 %-40 %] 30 % ?Set Rate:  [22 bmp] 22 bmp ?Vt Set:  [360 mL] 360 mL ?PEEP:  [5 cmH20] 5 cmH20 ?Plateau Pressure:  [11 cmH20-17 cmH20] 17 cmH20  ? ?Intake/Output Summary (Last 24 hours) at 03/08/2022 0755 ?Last data filed at 03/08/2022 0700 ?Gross per 24 hour  ?Intake 3954.91 ml  ?Output 350 ml   ?Net 3604.91 ml  ? ? ?Filed Weights  ? 03/09/2022 1200 03/07/22 0500 03/08/22 0500  ?Weight: 53.2 kg 54 kg 53.8 kg  ? ?Physical exam:  ?General: Acute on chronically ill-appearing female, lying on the bed ?HEENT: [place]/AT, eyes anicteric.  moist mucus membranes ?Neuro: Left gaze preference. Unable to follow commands but responds to noxious stimuli. ?Chest: Clear to auscultation throughout. No wheezing. ?Heart: Regular rate and rhythm. No murmurs.  ?Abdomen: Soft, nontender, nondistended, bowel sounds present ?Skin: No rash ? ?Resolved Hospital Problem list   ? ? ?Assessment & Plan:  ? ?Acute left MCA stroke s/p TNK and thrombectomy of left M1 occlusion with stent placement ?Right MCA/ACA infarcts ?- Given wide spread nature of infarcts, concerning for cardio-embolic source. Telemetry on the AM of 4/17 consistent with paroxysmal A. Fib ?- Stroke team is following ?- Continue secondary stroke prophylaxis ?- Continue Aspirin and Brilinta ?- Continue Simvastatin ?- Goal SBP < 180 and DBP < 110 ? ?Acute respiratory failure with hypoxia ?- Secondary to acute neurological deficits with inability to protect airway. Required re-intubation on 4/17.  ?- Continue full vent support ?- Daily SBTs. Not a candidate for extubation though due to lack of neurological recovery ?- Discontinue Unasyn. If patient develops fevers or worsening pulmonary examination, can add Rocephin for aspiration coverage  ?- Will need goals of care discussion regarding next steps, including tracheostomy. Palliative consulted. Family meeting planned for AM of 4/18.  ? ?Paroxysmal Atrial Fibrillation with RVR ?- Transition Amiodarone to 200 mg BID  ?- CHADs-VASc elevated at  6, however given need for DAPT and high risk for hemorrhagic conversion, will hold off on initiating AC ? ?Hypertension ?- Blood pressure at goal today. Not on any anti-hypertensives at this time.  ? ?GERD ?- Continue Protonix ? ?Best Practice (right click and "Reselect all SmartList  Selections" daily)  ? ?Diet/type: NPO ?DVT prophylaxis: Subcu Lovenox ?GI prophylaxis: PPI ?Lines: N/A ?Foley: Discontinue ?Code Status:  full code ?Last date of multidisciplinary goals of care discussion [4/17. Palliative consulted.] ? ?Labs   ?CBC: ?Recent Labs  ?Lab 02/20/2022 ?1221 03/10/2022 ?1226 03/08/2022 ?1951 03/08/2022 ?2013 03/05/22 ?0503 03/06/22 ?1003 03/07/22 ?[id number] 03/07/22 ?[id number] 03/08/22 ?[id number]  ?WBC 6.5  --  7.6  --  7.4 12.9*  --  12.1* 10.0  ?NEUTROABS 3.8  --   --   --  6.0  --   --   --   --   ?HGB 11.7*   < > 11.4*   < > 10.4* 8.9* 8.8* 9.1* 8.4*  ?HCT 35.6*   < > 34.0*   < > 30.0* 26.2* 26.0* 27.0* 25.6*  ?MCV 103.2*  --  100.9*  --  99.7 101.6*  --  101.9* 102.4*  ?PLT 213  --  213  --  235 218  --  197 186  ? < > = values in this interval not displayed.  ? ? ? ?Basic Metabolic Panel: ?Recent Labs  ?Lab 03/20/2022 ?1221 02/23/2022 ?1226 03/16/2022 ?2013 03/05/22 ?0503 03/06/22 ?1003 03/06/22 ?1527 03/07/22 ?[id number] 03/07/22 ?0919 03/07/22 ?1609 03/08/22 ?0612  ?NA 138 140   < > 140 140  --  139 140  --  139  ?K 3.8 3.8   < > 3.7 3.2*  --  3.9 4.0  --  3.4*  ?CL 108 103  --  113* 112*  --   --  110  --  108  ?CO2 23  --   --  21* 20*  --   --  24  --  26  ?GLUCOSE 110* 108*  --  155* 99  --   --  175*  --  179*  ?BUN 16 18  --  13 10  --   --  13  --  15  ?CREATININE 0.99 1.00  --  0.86 0.89  --   --  0.69  --  0.67  ?CALCIUM 9.6  --   --  8.8* 8.6*  --   --  8.8*  --  8.5*  ?MG  --   --   --   --   --  1.6*  --  1.7 1.7 1.7  ?PHOS  --   --   --   --   --  2.7  --  2.3* 2.6 2.6  ? < > = values in this interval not displayed.  ? ? ?GFR: ?Estimated Creatinine Clearance: 36.3 mL/min (by C-G formula based on SCr of 0.67 mg/dL). ?Recent Labs  ?Lab 03/05/22 ?0503 03/06/22 ?1003 03/07/22 ?0919 03/08/22 ?[id number]  ?WBC 7.4 12.9* 12.1* 10.0  ? ? ? ?Liver Function Tests: ?Recent Labs  ?Lab 03/18/2022 ?1221  ?AST 15  ?ALT 10  ?ALKPHOS 69  ?BILITOT 0.6  ?PROT 6.2*  ?ALBUMIN 3.4*  ? ? ?No results for input(s): LIPASE, AMYLASE in  the last 168 hours. ?No results for input(s): AMMONIA in the last 168 hours. ? ?ABG ?   ?Component Value Date/Time  ? PHART 7.469 (H) 03/07/2022 0856  ? PCO2ART 37.7 03/07/2022 0856  ? PO2ART 449 (H)  03/07/2022 0856  ? HCO3 27.3 03/07/2022 0856  ? TCO2 28 03/07/2022 0856  ? ACIDBASEDEF 2.0 03/16/2022 2013  ? O2SAT 100 03/07/2022 0856  ? ?  ? ?Coagulation Profile: ?Recent Labs  ?Lab 03/17/2022 ?1221  ?INR 1.1  ? ? ? ?Cardiac Enzymes: ?No results for input(s): CKTOTAL, CKMB, CKMBINDEX, TROPONINI in the last 168 hours. ? ?HbA1C: ?Hgb A1c MFr Bld  ?Date/Time Value Ref Range Status  ?03/09/2022 07:51 PM 5.7 (H) 4.8 - 5.6 % Final  ?  Comment:  ?  (NOTE) ?Pre diabetes:          5.7%-6.4% ? ?Diabetes:              >6.4% ? ?Glycemic control for   <7.0% ?adults with diabetes ?  ? ? ?CBG: ?Recent Labs  ?Lab 03/07/22 ?1158 03/07/22 ?1523 03/07/22 ?1931 03/07/22 ?2350 03/08/22 ?0337  ?GLUCAP 154* 137* 158* 145* 161*  ? ? ?Dr. Jose Persia ?Internal Medicine PGY-3  ? ?03/08/2022, 7:55 AM ? ?

## 2022-03-08 NOTE — Progress Notes (Signed)
Patient placed back on full vent support due to apnea.  Family at bedside. RN notified. ?

## 2022-03-08 NOTE — Progress Notes (Addendum)
STROKE TEAM PROGRESS NOTE  ? ?SUBJECTIVE (INTERVAL HISTORY) ?Moving LUE and LLE purposefully, flaccid on the right side. Opening eyes to noxious stimuli, left gaze preference ?Daughter is at the bedside. Plan for family meeting around [place].  ?Amiodarone and precedex infusing ? ?OBJECTIVE ?Temp:  [97.1 ?F (36.2 ?C)-98 ?F (36.7 ?C)] 97.9 ?F (36.6 ?C) (04/18 0400) ?Pulse Rate:  [58-128] 60 (04/18 0814) ?Cardiac Rhythm: Normal sinus rhythm (04/18 0800) ?Resp:  [14-26] 15 (04/18 [id number]) ?BP: (97-146)/(50-106) 108/54 (04/18 [id number]) ?SpO2:  [98 %-100 %] 99 % (04/18 0814) ?FiO2 (%):  [30 %-40 %] 30 % (04/18 0814) ?Weight:  [53.8 kg] 53.8 kg (04/18 0500) ? ?Recent Labs  ?Lab 03/07/22 ?1523 03/07/22 ?1931 03/07/22 ?2350 03/08/22 ?[id number] 03/08/22 ?[id number]  ?GLUCAP 137* 158* 145* 161* 176*  ? ? ?Recent Labs  ?Lab 03/11/2022 ?1221 03/14/2022 ?1226 02/19/2022 ?2013 03/05/22 ?0503 03/06/22 ?1003 03/06/22 ?1527 03/07/22 ?[id number] 03/07/22 ?0919 03/07/22 ?1609 03/08/22 ?0612  ?NA 138 140   < > 140 140  --  139 140  --  139  ?K 3.8 3.8   < > 3.7 3.2*  --  3.9 4.0  --  3.4*  ?CL 108 103  --  113* 112*  --   --  110  --  108  ?CO2 23  --   --  21* 20*  --   --  24  --  26  ?GLUCOSE 110* 108*  --  155* 99  --   --  175*  --  179*  ?BUN 16 18  --  13 10  --   --  13  --  15  ?CREATININE 0.99 1.00  --  0.86 0.89  --   --  0.69  --  0.67  ?CALCIUM 9.6  --   --  8.8* 8.6*  --   --  8.8*  --  8.5*  ?MG  --   --   --   --   --  1.6*  --  1.7 1.7 1.7  ?PHOS  --   --   --   --   --  2.7  --  2.3* 2.6 2.6  ? < > = values in this interval not displayed.  ? ? ?Recent Labs  ?Lab 03/15/2022 ?1221  ?AST 15  ?ALT 10  ?ALKPHOS 69  ?BILITOT 0.6  ?PROT 6.2*  ?ALBUMIN 3.4*  ? ? ?Recent Labs  ?Lab 03/02/2022 ?1221 02/21/2022 ?1226 03/08/2022 ?1951 03/01/2022 ?2013 03/05/22 ?0503 03/06/22 ?1003 03/07/22 ?[id number] 03/07/22 ?[id number] 03/08/22 ?[id number]  ?WBC 6.5  --  7.6  --  7.4 12.9*  --  12.1* 10.0  ?NEUTROABS 3.8  --   --   --  6.0  --   --   --   --   ?HGB 11.7*   < > 11.4*   < > 10.4* 8.9*  8.8* 9.1* 8.4*  ?HCT 35.6*   < > 34.0*   < > 30.0* 26.2* 26.0* 27.0* 25.6*  ?MCV 103.2*  --  100.9*  --  99.7 101.6*  --  101.9* 102.4*  ?PLT 213  --  213  --  235 218  --  197 186  ? < > = values in this interval not displayed.  ? ? ?No results for input(s): CKTOTAL, CKMB, CKMBINDEX, TROPONINI in the last 168 hours. ?No results for input(s): LABPROT, INR in the last 72 hours. ? ?Recent Labs  ?  03/05/22 ?1017  ?COLORURINE YELLOW  ?LABSPEC 1.044*  ?PHURINE 5.0  ?  GLUCOSEU NEGATIVE  ?HGBUR NEGATIVE  ?BILIRUBINUR NEGATIVE  ?KETONESUR NEGATIVE  ?PROTEINUR NEGATIVE  ?NITRITE NEGATIVE  ?LEUKOCYTESUR NEGATIVE  ? ?  ?   ?Component Value Date/Time  ? CHOL 125 03/05/2022 0503  ? TRIG 46 03/08/2022 0612  ? HDL 45 03/05/2022 0503  ? CHOLHDL 2.8 03/05/2022 0503  ? VLDL 20 03/05/2022 0503  ? [place] 60 03/05/2022 0503  ? ?Lab Results  ?Component Value Date  ? HGBA1C 5.7 (H) 02/27/2022  ? ?   ?Component Value Date/Time  ? LABOPIA NONE DETECTED 03/05/2022 1017  ? COCAINSCRNUR NONE DETECTED 03/05/2022 1017  ? LABBENZ NONE DETECTED 03/05/2022 1017  ? AMPHETMU NONE DETECTED 03/05/2022 1017  ? THCU NONE DETECTED 03/05/2022 1017  ? LABBARB NONE DETECTED 03/05/2022 1017  ?  ?Recent Labs  ?Lab 03/06/2022 ?1221  ?ETH <10  ? ? ? ?I have personally reviewed the radiological images below and agree with the radiology interpretations. ? ?DG Abd 1 View ? ?Result Date: 03/06/2022 ?CLINICAL DATA:  Nasogastric tube placement. EXAM: ABDOMEN - 1 VIEW COMPARISON:  03/05/2022 FINDINGS: Nasogastric tube remains in place with tip overlying the gastric antrum. No evidence of dilated bowel loops. IMPRESSION: Nasogastric tube tip overlies the gastric antrum. Electronically Signed   By: Marlaine Hind M.D.   On: 03/06/2022 09:10  ? ?DG Abd 1 View ? ?Result Date: 03/05/2022 ?CLINICAL DATA:  OG tube placement EXAM: ABDOMEN - 1 VIEW COMPARISON:  None. FINDINGS: Esophageal tube tip overlies the gastric body. Atelectasis or scar at the left base. Visible gas pattern is  unobstructed IMPRESSION: Esophageal tube tip overlies the body of the stomach Electronically Signed   By: Donavan Foil M.D.   On: 03/05/2022 19:53  ? ?MR ANGIO HEAD WO CONTRAST ? ?Result Date: 03/05/2022 ?CLINICAL DATA:  Stroke, follow-up. Status post revascularization of left M1 occlusion. EXAM: MRA HEAD WITHOUT CONTRAST TECHNIQUE: Angiographic images of the Circle of Willis were acquired using MRA technique without intravenous contrast. FINDINGS: Anterior circulation: The right internal carotid artery, A1 segment and M1 segments are normal. Right ACA and MCA branch vessels are normal. The left M1 segment remains patent with signal to the stent. Artifact noted at the stent. Anterior branch vessels are not seen. Posterior circulation: The left vertebral artery is dominant. Basilar artery is within normal limits. Posterior cerebral arteries originate from the basilar tip. PCA branch vessels are within normal limits. Anatomic variants: None Other: None. IMPRESSION: 1. Patent left M1 segment with signal to the stent. 2. Posterior branches distal to the stent less prominent than on the previous study. 3. Persistent occlusion of anterior branches. 4. Right MCA, bilateral ACA, and posterior circulation within normal limits. Electronically Signed   By: San Morelle M.D.   On: 03/05/2022 15:04  ? ?MR BRAIN WO CONTRAST ? ?Result Date: 03/05/2022 ?CLINICAL DATA:  Neuro deficit, acute, stroke suspected. Sudden onset right-sided weakness, left gaze deviation and right hemianopsia. Patient was found unresponsive. EXAM: MRI HEAD WITHOUT CONTRAST TECHNIQUE: Multiplanar, multiecho pulse sequences of the brain and surrounding structures were obtained without intravenous contrast. COMPARISON:  CT head without contrast 03/03/2022 and CTA head and neck 03/15/2022 FINDINGS: Brain: Restricted diffusion is present with acute nonhemorrhagic infarct involving the left lentiform nucleus, the anterior insular cortex, left corona  radiata and scattered areas in the high left frontal and parietal cortex. A single cortical infarct in the anterior right frontal lobe measures 6 mm on image 39 of series 2. A separate posterior MCA territory infarct involves the posterior left  parietal and temporal lobe, potentially near the watershed. T2 hyperintensities are associated with these areas of acute/subacute infarction. Diffuse periventricular and subcortical white matter changes are also present bilaterally. White matter changes extend into the brainstem. Remote infarcts are present posterior cerebellum bilaterally without hemorrhage. The ventricles are of normal size. No significant extraaxial fluid collection is present. The internal auditory canals are within normal limits. Vascular: Flow is present in the major intracranial arteries. Skull and upper cervical spine: The craniocervical junction is normal. Upper cervical spine is within normal limits. Marrow signal is unremarkable. Sinuses/Orbits: Right sphenoid sinus is opacified. The paranasal sinuses and mastoid air cells are otherwise clear. Bilateral lens replacements are noted. Globes and orbits are otherwise unremarkable. IMPRESSION: 1. Acute/subacute nonhemorrhagic infarct involving the left lentiform nucleus, anterior insular cortex, left corona radiata, and scattered areas in the high left frontal and parietal cortex. 2. Additional posterior left MCA territory infarct, potentially near the watershed involving the posterior left parietal lobe and temporal lobe. 3. Single 6 mm cortical infarct in the anterior right frontal lobe. 4. Remote infarcts of the posterior cerebellum bilaterally. 5. Diffuse periventricular and subcortical white matter disease bilaterally likely reflects the sequela of chronic microvascular ischemia. Electronically Signed   By: San Morelle M.D.   On: 03/05/2022 14:58  ? ?[place] ? ?Result Date: 03/08/2022 ?INDICATION: New onset global aphasia and  right-sided hemiplegia and left gaze deviation. Occluded left middle cerebral artery M1 segment on CT angiogram of the head and neck. EXAM: 1. EMERGENT LARGE VESSEL OCCLUSION THROMBOLYSIS (anterior CIRCULATION) COMP

## 2022-03-08 NOTE — Progress Notes (Signed)
? ?                                                                                                                                                     ?                                                   ?Daily Progress Note  ? ?Patient Name: Elizabeth Brock       Date: 03/08/2022 ?DOB: 04-27-1933  Age: 88 y.o. MRN#: [id number] ?Attending Physician: Stroke, Md, MD ?Primary Care Physician: Elizabeth Shanks, MD ?Admit Date: 02/21/2022 ? ?Reason for Consultation/Follow-up: Establishing goals of care ? ?Patient Profile/HPI:   88 y.o. female  with past medical history of GERD, HTN, HLD, vit d deficiency admitted on 03/08/2022 with stroke- received TNK and had thrombectomy with stent placement. Extubated on 4/15. She remained lethargic after extubation. On 4/17 went into respiratory failure- probably aspiration pneumonia and required reintubation. She is having A fib with RVR. Palliative consulted for [place].  ? ?Subjective: [name] meeting held with patient's spouse, children and grandchild.  ? ?I have reviewed medical records including EPIC notes, labs and imaging, received report from,  met with  to discuss diagnosis prognosis, GOC, EOL wishes, disposition and options. ? ?I introduced Palliative Medicine as specialized medical care for people living with serious illness. It focuses on providing relief from the symptoms and stress of a serious illness. The goal is to improve quality of life for both the patient and the family. ? ?We discussed a brief life review of the patient.  She is a very Advertising account executive.  Her family immigrated from [place].  She is known to be very loving and nurturing.  She enjoys cooking she is famous for her macaroni and her meatballs. ? ?As far as functional and nutritional status-prior to this admission she was living independently with her spouse. ? ? We discussed patient's current illness and what it means in the larger context of patient's on-going co-morbidities.  Natural disease trajectory and  expectations at EOL were discussed. ? ?I attempted to elicit values and goals of care important to the patient.   ? ?The difference between aggressive medical intervention and comfort care was reviewed in detail and encouraged family to consider choice of path in the context patient's goals of care.  ? ?Family inquired about prognosis. We discussed best case scenario post stroke which would include [name] improving well and rehabilitation. We discussed the worst case which is little improvement and continued complications. I shared that often patient end up somewhere in the middle requiring a new level of care often for the rest of their life.   ? ?Advance  directives, concepts specific to code status, artificial feeding and hydration, and rehospitalization were considered and discussed.  [name] does have a living will and advanced directive indicating she would not want prolonged artificial life supporting measures or artificial feeding. ? ?CODE STATUS was discussed-all are in agreement that if patient experiences cardiac arrest despite all current interventions then they wish to allow her to remain having peacefully passed and not proceed with  CPR.  They endorse full scope care otherwise. ? ?Discussed with patient/family the importance of continued conversation with family and the medical providers regarding overall plan of care and treatment options, ensuring decisions are within the context of the patient?s values and GOCs.   ? ?Questions and concerns were addressed. The family was encouraged to call with questions or concerns.  ? ? ?Review of Systems  ?Unable to perform ROS: Intubated  ? ? ?Physical Exam ?Vitals and nursing note reviewed.  ?Skin: ?   Comments: Scattered bruising  ?Neurological:  ?   Comments: unresponsive  ?         ? ?Vital Signs: BP (!) 108/54   Pulse 60   Temp 98 ?F (36.7 ?C) (Axillary)   Resp 15   Ht 5' (1.524 m)   Wt 53.8 kg   SpO2 99%   BMI 23.16 kg/m?  ?SpO2: SpO2: 99 % ?O2  Device: O2 Device: Ventilator ?O2 Flow Rate: O2 Flow Rate (L/min): 2 L/min ? ?Intake/output summary:  ?Intake/Output Summary (Last 24 hours) at 03/08/2022 1117 ?Last data filed at 03/08/2022 0800 ?Gross per 24 hour  ?Intake 2957.3 ml  ?Output --  ?Net 2957.3 ml  ? ?LBM: Last BM Date :  (PTA) ?Baseline Weight: Weight: 53.2 kg ?Most recent weight: Weight: 53.8 kg ? ?     ?Palliative Assessment/Data: PPS: 10% ? ? ? ? ? ?Patient Active Problem List  ? Diagnosis Date Noted  ? Ventilator dependence ([place])   ? Acute ischemic left MCA stroke ([place]) 03/11/2022  ? Middle cerebral artery embolism, left 02/26/2022  ? ? ?Palliative Care Assessment & Plan  ? ? ?Assessment/Recommendations/Plan ? ?Respiratory failure in the setting of acute stroke- continue current full scope care, DNR, family to discuss transition to comfort vs ongoing aggressive care- she does have a living will so would not recommend trach, PEG ?Plan to reconvene tomorrow at 10am for further discussion after family has time to discuss options ?DNR code status order entered ? ? ?Code Status: ?DNR ? ?Prognosis: ? Unable to determine ? ?Discharge Planning: ?To Be Determined ? ?Care plan was discussed with patient's family and care team. ? ?Thank you for allowing the Palliative Medicine Team to assist in the care of this patient. ? ?Total time:  120 minutes ? ?Elizabeth Brock, AGNP-C ?Palliative Medicine ? ? ?Please contact Palliative Medicine Team phone at [phone] for questions and concerns.  ? ? ? ? ? ? ?

## 2022-03-08 NOTE — Progress Notes (Signed)
SLP Cancellation Note ? ?Patient Details ?Name: Elizabeth Brock ?MRN: [id number] ?DOB: 1933-11-08 ? ? ?Cancelled treatment:       Reason Eval/Treat Not Completed: Medical issues which prohibited therapy. Pt remains intubated. Note plans for [place] discussion today as well. Will f/u as able.  ? ? ? ?[name], M.A. CCC-SLP ?Acute Rehabilitation Services ?Office [phone] ? ?Secure chat preferred ? ?03/08/2022, 9:00 AM ?

## 2022-03-08 NOTE — Progress Notes (Signed)
eLink Physician-Brief Progress Note ?Patient Name: Elizabeth Brock ?DOB: [birth year] ?MRN: [id number] ? ? ?Date of Service ? 03/08/2022  ?HPI/Events of Note ? Agitation - Patient reaching for tubes. Nursing request for L wrist restraint.  ?eICU Interventions ? Plan: ?Will order L soft wrist restraint X 3 hours.  ? ? ? ?Intervention Category ?Major Interventions: Delirium, psychosis, severe agitation - evaluation and management ? ?[name] ?03/08/2022, 6:22 AM ?

## 2022-03-08 NOTE — Progress Notes (Signed)
OT Cancellation Note ? ?Patient Details ?Name: Elizabeth Brock ?MRN: [id number] ?DOB: November 07, 1933 ? ? ?Cancelled Treatment:    Reason Eval/Treat Not Completed: Medical issues which prohibited therapy (Pt continues to be intubated. Per chart GOC meeting with family planned for today. OT evaluation to f/u as appropriate.) ? ?[name] A Khup Sapia ?03/08/2022, 9:06 AM ?

## 2022-03-08 NOTE — Evaluation (Signed)
Occupational Therapy Evaluation ?Patient Details ?Name: Elizabeth Brock ?MRN: [id number] ?DOB: 1933/09/29 ?Today's Date: 03/08/2022 ? ? ?History of Present Illness Pt is 88 yo female who presents with R sided weakness, L gaze deviation, and R hemianopsia after being found down by husband. MRI shows moderate infarct of L MCA, small scattered infarcts of R MCA/ACA. Received TNKase and underwent revascularization of L MCA. Extubated 4/15. PMH: GERD, HTN, HLD.  ? ?Clinical Impression ?  ?Elizabeth Brock was evaluated s/p the above CVA. Per chart, she is generally mod I with mobility and ADLs, her husband completes IADLs and she had limited activity tolerance. Pt seen while intubated, she had her eyes closed 50% of the session, did open to stimuli and sustained a L gaze. Her R extremities are flaccid with significant edema of RUE. Overall she is total A +2 for all aspects of her care including bed mobility and sitting balance. In sitting pt did follow 5% of commands on the L and made efforts to right her balance. Pt will benefit from OT acutely to progress the limitations listed below. Recommend d/c to AIR in hopes of improved activity tolerance once extubated.   ? ?Recommendations for follow up therapy are one component of a multi-disciplinary discharge planning process, led by the attending physician.  Recommendations may be updated based on patient status, additional functional criteria and insurance authorization.  ? ?Follow Up Recommendations ? Acute inpatient rehab (3hours/day)  ?  ?   ?Patient can return home with the following Two people to help with walking and/or transfers;Two people to help with bathing/dressing/bathroom;Assistance with feeding;Direct supervision/assist for financial management;Direct supervision/assist for medications management;Assist for transportation;Help with stairs or ramp for entrance ? ?  ?Functional Status Assessment ? Patient has had a recent decline in their functional status and demonstrates  the ability to make significant improvements in function in a reasonable and predictable amount of time.  ?Equipment Recommendations ? Wheelchair (measurements OT);Wheelchair cushion (measurements OT);Hospital bed;Other (comment) (lfit equipment)  ?  ?Recommendations for Other Services Rehab consult ? ? ?  ?Precautions / Restrictions Precautions ?Precautions: Fall ?Precaution Comments: SBP < 180 ?Restrictions ?Weight Bearing Restrictions: No  ? ?  ? ?Mobility Bed Mobility ?Overal bed mobility: Needs Assistance ?Bed Mobility: Supine to Sit, Sit to Supine ?  ?  ?Supine to sit: Total assist, +2 for physical assistance ?Sit to supine: Total assist, +2 for physical assistance ?  ?General bed mobility comments: pt moved LLE in bed but not to EOB when cued. tot A for supine to sit with avoidance of coming to midline ?  ? ?Transfers ?  ?  ?  ?  ?  ?  ?  ?  ?  ?General transfer comment: pt lethargic and not appropriate for transferring yet ?  ? ?  ?Balance Overall balance assessment: Needs assistance ?Sitting-balance support: Single extremity supported ?Sitting balance-Leahy Scale: Poor ?Sitting balance - Comments: posterior right lean but pt engaging core when posterior support taken away, able to sit with as little as min A for short periods of time. Tends to brace with LUE. Worked on maintaining midline in sitting. ?Postural control: Right lateral lean ?  ?  ?  ?  ?  ?  ?  ?  ?  ?  ?  ?  ?  ?  ?   ? ?ADL either performed or assessed with clinical judgement  ? ?ADL Overall ADL's : Needs assistance/impaired ?  ?  ?  ?  ?  ?  ?  ?  ?  ?  ?  ?  ?  ?  ?  ?  ?  ?  ?  ?  General ADL Comments: total A all aspects of care  ? ? ? ?Vision Patient Visual Report: Other (comment) (unable to report) ?Vision Assessment?: Vision impaired- to be further tested in functional context ?Additional Comments: sustained L gaze, uable to come to midline despite max cues/stimuli  ?   ?   ?   ? ?Pertinent Vitals/Pain Pain Assessment ?Pain Assessment:  Faces ?Faces Pain Scale: Hurts a little bit ?Pain Location: grimacing with PROM of neck and R knee ?Pain Descriptors / Indicators: Grimacing ?Pain Intervention(s): Limited activity within patient's tolerance, Monitored during session  ? ? ? ?Hand Dominance   ?  ?Extremity/Trunk Assessment Upper Extremity Assessment ?Upper Extremity Assessment: RUE deficits/detail ?RUE Deficits / Details: flaccid with significant swelling ?RUE Coordination: decreased fine motor;decreased gross motor ?  ?Lower Extremity Assessment ?Lower Extremity Assessment: Defer to PT evaluation ?  ?Cervical / Trunk Assessment ?Cervical / Trunk Assessment: Kyphotic;Other exceptions ?Cervical / Trunk Exceptions: L gaze, unable to reach midline, no active cervical rotation during session ?  ?Communication Communication ?Communication: Expressive difficulties;Other (comment) (intubated - seems to be following some commands) ?  ?Cognition Arousal/Alertness: Lethargic, Suspect due to medications ?Behavior During Therapy: Flat affect ?Overall Cognitive Status: Difficult to assess ?  ?  ?  ?  ?  ?  ?  ?  ?  ?  ?  ?  ?  ?  ?  ?  ?General Comments: kept eyes closed most of session, following commands on L side about 5% of time ?  ?  ?General Comments  VSS ? ?  ?Exercises Exercises: Other exercises, General Lower Extremity ?  ?   ? ? ?Home Living Family/patient expects to be discharged to:: Private residence ?Living Arrangements: Spouse/significant other ?Available Help at Discharge: Family;Available PRN/intermittently;Available 24 hours/day ?Type of Home: House ?  ?  ?  ?  ?  ?  ?  ?  ?  ?  ?  ?  ?  ?  ? Lives With: Spouse ? ?  ?Prior Functioning/Environment Prior Level of Function : Needs assist ?  ?  ?  ?Physical Assist : Mobility (physical);ADLs (physical) ?Mobility (physical): Gait;Stairs ?ADLs (physical): IADLs ?Mobility Comments: pt family present and state she lives with spouse, neither with great mobility, pt using RW and spouse with cane. state she  didnt need assist to walk, but had poor endurance and recently had PT to improve balance which the pt did not enjoy but did benefit from ?ADLs Comments: pt spouse does a lot of IADls and driving for the couple, family states she was independent with ADLs as far as they know ?  ? ?  ?  ?OT Problem List: Decreased strength;Decreased range of motion;Decreased activity tolerance;Impaired balance (sitting and/or standing);Decreased coordination;Decreased cognition;Impaired vision/perception;Decreased safety awareness;Decreased knowledge of use of DME or AE;Decreased knowledge of precautions;Impaired UE functional use;Increased edema ?  ?   ?OT Treatment/Interventions: Self-care/ADL training;Therapeutic exercise;Neuromuscular education;DME and/or AE instruction;Therapeutic activities;Patient/family education;Balance training  ?  ?OT Goals(Current goals can be found in the care plan section) Acute Rehab OT Goals ?Patient Stated Goal: unable to state ?OT Goal Formulation: Patient unable to participate in goal setting ?Time For Goal Achievement: 03/22/22 ?Potential to Achieve Goals: Good ?ADL Goals ?Pt Will Perform Grooming: with mod assist;sitting ?Additional ADL Goal #1: Pt will complete bed mobility with mod A as a precursor to ADLs ?Additional ADL Goal #2: Pt will attend to midline with min cues to assist in grooming  ?OT Frequency: Min 2X/week ?  ? ?Co-evaluation PT/OT/SLP  Co-Evaluation/Treatment: Yes ?Reason for Co-Treatment: Complexity of the patient's impairments (multi-system involvement);To address functional/ADL transfers;For patient/therapist safety ?  ?OT goals addressed during session: ADL's and self-care ?  ? ?  ?AM-PAC OT "6 Clicks" Daily Activity     ?Outcome Measure Help from another person eating meals?: Total ?Help from another person taking care of personal grooming?: Total ?Help from another person toileting, which includes using toliet, bedpan, or urinal?: Total ?Help from another person bathing  (including washing, rinsing, drying)?: Total ?Help from another person to put on and taking off regular upper body clothing?: Total ?Help from another person to put on and taking off regular lower body cl

## 2022-03-08 NOTE — Progress Notes (Signed)
Physical Therapy Treatment ?Patient Details ?Name: Elizabeth Brock ?MRN: [id number] ?DOB: 1933-02-23 ?Today's Date: 03/08/2022 ? ? ?History of Present Illness Pt is 88 yo female who presents with R sided weakness, L gaze deviation, and R hemianopsia after being found down by husband. MRI shows moderate infarct of L MCA, small scattered infarcts of R MCA/ACA. Received TNKase and underwent revascularization of L MCA. Extubated 4/15. PMH: GERD, HTN, HLD. ? ?  ?PT Comments  ? ? Pt lethargic but opens eyes when spoken to. Came to EOB with tot A +2. Actively moving LLE and LUE but rarely doing so when instructed. Worked on midline sitting and trunk control. Pt tends to brace with LUE with posterior and R lean. OT worked on engaging pt with self care tasks while maintaining sitting EOB. PT will continue to follow. ?   ?Recommendations for follow up therapy are one component of a multi-disciplinary discharge planning process, led by the attending physician.  Recommendations may be updated based on patient status, additional functional criteria and insurance authorization. ? ?Follow Up Recommendations ? Acute inpatient rehab (3hours/day) ?  ?  ?Assistance Recommended at Discharge Frequent or constant Supervision/Assistance  ?Patient can return home with the following Two people to help with walking and/or transfers;Two people to help with bathing/dressing/bathroom;Assistance with cooking/housework;Assistance with feeding;Direct supervision/assist for medications management;Direct supervision/assist for financial management;Assist for transportation;Help with stairs or ramp for entrance ?  ?Equipment Recommendations ? Wheelchair (measurements PT);Wheelchair cushion (measurements PT);Hospital bed (lift)  ?  ?Recommendations for Other Services Rehab consult ? ? ?  ?Precautions / Restrictions Precautions ?Precautions: Fall ?Precaution Comments: SBP < 180 ?Restrictions ?Weight Bearing Restrictions: No  ?  ? ?Mobility ? Bed  Mobility ?Overal bed mobility: Needs Assistance ?Bed Mobility: Supine to Sit, Sit to Supine ?  ?  ?Supine to sit: Total assist, +2 for physical assistance ?Sit to supine: Total assist, +2 for physical assistance ?  ?General bed mobility comments: pt moved LLE in bed but not to EOB when cued. tot A for supine to sit with avoidance of coming to midline ?  ? ?Transfers ?  ?  ?  ?  ?  ?  ?  ?  ?  ?General transfer comment: pt lethargic and not appropriate for transferring yet ?  ? ?Ambulation/Gait ?  ?  ?  ?  ?  ?  ?  ?  ? ? ?Stairs ?  ?  ?  ?  ?  ? ? ?Wheelchair Mobility ?  ? ?Modified Rankin (Stroke Patients Only) ?Modified Rankin (Stroke Patients Only) ?Pre-Morbid Rankin Score: Slight disability ?Modified Rankin: Severe disability ? ? ?  ?Balance Overall balance assessment: Needs assistance ?Sitting-balance support: Single extremity supported ?Sitting balance-Leahy Scale: Poor ?Sitting balance - Comments: posterior right lean but pt engaging core when posterior support taken away, able to sit with as little as min A for short periods of time. Tends to brace with LUE. Worked on maintaining midline in sitting. ?Postural control: Right lateral lean ?  ?  ?  ?  ?  ?  ?  ?  ?  ?  ?  ?  ?  ?  ?  ? ?  ?Cognition Arousal/Alertness: Lethargic, Suspect due to medications ?Behavior During Therapy: Flat affect ?Overall Cognitive Status: Difficult to assess ?  ?  ?  ?  ?  ?  ?  ?  ?  ?  ?  ?  ?  ?  ?  ?  ?General Comments: kept eyes  closed most of session, following commands re: L side about 5% of time ?  ?  ? ?  ?Exercises General Exercises - Lower Extremity ?Long Arc Quad: Both, 5 reps, Seated, PROM, Limitations ?Long [employer] Limitations: no active motion noted of RLE. When LLE extended by therapist, pt kept it in extension ?Other Exercises ?Other Exercises: stretch to cervical spine to come to midline, pt could tolerate but was uncomfortable. Pillow placed under L sided head to maintain midline end of session ? ?  ?General  Comments General comments (skin integrity, edema, etc.): VSS ?  ?  ? ?Pertinent Vitals/Pain Pain Assessment ?Pain Assessment: Faces ?Faces Pain Scale: Hurts a little bit ?Pain Location: grimacing with PROM of neck and R knee ?Pain Descriptors / Indicators: Grimacing ?Pain Intervention(s): Limited activity within patient's tolerance, Monitored during session  ? ? ?Home Living   ?  ?  ?  ?  ?  ?  ?  ?  ?  ?   ?  ?Prior Function    ?  ?  ?   ? ?PT Goals (current goals can now be found in the care plan section) Acute Rehab PT Goals ?Patient Stated Goal: none stated, pt intubated ?PT Goal Formulation: With patient/family ?Time For Goal Achievement: 03/20/22 ?Potential to Achieve Goals: Fair ?Progress towards PT goals: Not progressing toward goals - comment (lethargic, intubated) ? ?  ?Frequency ? ? ? Min 4X/week ? ? ? ?  ?PT Plan Current plan remains appropriate  ? ? ?Co-evaluation   ?  ?  ?  ?  ? ?  ?AM-PAC PT "6 Clicks" Mobility   ?Outcome Measure ? Help needed turning from your back to your side while in a flat bed without using bedrails?: Total ?Help needed moving from lying on your back to sitting on the side of a flat bed without using bedrails?: Total ?Help needed moving to and from a bed to a chair (including a wheelchair)?: Total ?Help needed standing up from a chair using your arms (e.g., wheelchair or bedside chair)?: Total ?Help needed to walk in hospital room?: Total ?Help needed climbing 3-5 steps with a railing? : Total ?6 Click Score: 6 ? ?  ?End of Session   ?Activity Tolerance: Patient limited by fatigue ?Patient left: in bed;with call bell/phone within reach;with bed alarm set ?Nurse Communication: Mobility status ?PT Visit Diagnosis: Other abnormalities of gait and mobility (R26.89);Hemiplegia and hemiparesis;Muscle weakness (generalized) (M62.81) ?Hemiplegia - Right/Left: Right ?Hemiplegia - dominant/non-dominant: Dominant ?Hemiplegia - caused by: Cerebral infarction ?  ? ? ?Time: [id number] ?PT Time  Calculation (min) (ACUTE ONLY): 23 min ? ?Charges:  $Therapeutic Activity: 8-22 mins          ?          ? ?Leighton Roach, PT  ?Acute Rehab Services ? Pager [phone] ?Office [phone] ? ? ? ?[place] ?03/08/2022, 3:45 PM ? ?

## 2022-03-09 DIAGNOSIS — Z9911 Dependence on respirator [ventilator] status: Secondary | ICD-10-CM | POA: Diagnosis not present

## 2022-03-09 DIAGNOSIS — I6602 Occlusion and stenosis of left middle cerebral artery: Secondary | ICD-10-CM | POA: Diagnosis not present

## 2022-03-09 DIAGNOSIS — I63512 Cerebral infarction due to unspecified occlusion or stenosis of left middle cerebral artery: Secondary | ICD-10-CM | POA: Diagnosis not present

## 2022-03-09 LAB — BASIC METABOLIC PANEL
Anion gap: 6 (ref 5–15)
BUN: 16 mg/dL (ref 8–23)
CO2: 27 mmol/L (ref 22–32)
Calcium: 8.7 mg/dL — ABNORMAL LOW (ref 8.9–10.3)
Chloride: 107 mmol/L (ref 98–111)
Creatinine, Ser: 0.63 mg/dL (ref 0.44–1.00)
GFR, Estimated: >60 mL/min (ref >60)
Glucose, Bld: 130 mg/dL — ABNORMAL HIGH (ref 70–99)
Potassium: 3.9 mmol/L (ref 3.5–5.1)
Sodium: 140 mmol/L (ref 135–145)

## 2022-03-09 LAB — CBC
HCT: 27.9 % — ABNORMAL LOW (ref 36.0–46.0)
Hemoglobin: 9 g/dL — ABNORMAL LOW (ref 12.0–15.0)
MCH: 33.6 pg (ref 26.0–34.0)
MCHC: 32.3 g/dL (ref 30.0–36.0)
MCV: 104.1 fL — ABNORMAL HIGH (ref 80.0–100.0)
Platelets: 207 K/uL (ref 150–400)
RBC: 2.68 MIL/uL — ABNORMAL LOW (ref 3.87–5.11)
RDW: 14.3 % (ref 11.5–15.5)
WBC: 11.1 K/uL — ABNORMAL HIGH (ref 4.0–10.5)
nRBC: 0 % (ref 0.0–0.2)

## 2022-03-09 LAB — GLUCOSE, CAPILLARY
Glucose-Capillary: 130 mg/dL — ABNORMAL HIGH (ref 70–99)
Glucose-Capillary: 132 mg/dL — ABNORMAL HIGH (ref 70–99)
Glucose-Capillary: 132 mg/dL — ABNORMAL HIGH (ref 70–99)

## 2022-03-09 MED ORDER — POLYVINYL ALCOHOL 1.4 % OP SOLN
1.0000 [drp] | Freq: Four times a day (QID) | OPHTHALMIC | Status: DC | PRN
  Filled 2022-03-09: qty 15

## 2022-03-09 MED ORDER — GLYCOPYRROLATE 0.2 MG/ML IJ SOLN
0.2000 mg | INTRAMUSCULAR | Status: DC
  Administered 2022-03-09 – 2022-03-13 (×12): 0.2 mg via INTRAVENOUS
  Filled 2022-03-09 (×12): qty 1

## 2022-03-09 MED ORDER — DIPHENHYDRAMINE HCL 50 MG/ML IJ SOLN: 25.0000 mg | INTRAMUSCULAR | Status: DC | PRN

## 2022-03-09 MED ORDER — GLYCOPYRROLATE 0.2 MG/ML IJ SOLN: 0.2000 mg | INTRAMUSCULAR | Status: DC | PRN

## 2022-03-09 MED ORDER — LORAZEPAM 2 MG/ML IJ SOLN
2.0000 mg | INTRAMUSCULAR | Status: DC | PRN
  Administered 2022-03-09: 2 mg via INTRAVENOUS
  Filled 2022-03-09: qty 1

## 2022-03-09 MED ORDER — MORPHINE 100MG IN NS 100ML (1MG/ML) PREMIX INFUSION
1.0000 mg/h | INTRAVENOUS | Status: DC
  Administered 2022-03-09: 1 mg/h via INTRAVENOUS
  Administered 2022-03-10 (×2): 7 mg/h via INTRAVENOUS
  Administered 2022-03-10: 5 mg/h via INTRAVENOUS
  Administered 2022-03-11 – 2022-03-12 (×2): 7 mg/h via INTRAVENOUS
  Administered 2022-03-12 – 2022-03-13 (×3): 8 mg/h via INTRAVENOUS
  Filled 2022-03-09 (×8): qty 100

## 2022-03-09 MED ORDER — MORPHINE BOLUS VIA INFUSION
2.0000 mg | INTRAVENOUS | Status: DC | PRN
  Administered 2022-03-10 (×2): 2 mg via INTRAVENOUS
  Filled 2022-03-09: qty 2

## 2022-03-09 MED ORDER — GLYCOPYRROLATE 1 MG PO TABS: 1.0000 mg | ORAL_TABLET | ORAL | Status: DC | PRN

## 2022-03-09 NOTE — Progress Notes (Signed)
? ?                                                                                                                                                     ?                                                   ?Daily Progress Note  ? ?Patient Name: Elizabeth Brock       Date: 03/09/2022 ?DOB: [birth year]  Age: 88 y.o. MRN#: [id number] ?Attending Physician: Stroke, Md, MD ?Primary Care Physician: Vernie Shanks, MD ?Admit Date: 02/27/2022 ? ?Reason for Consultation/Follow-up: Establishing goals of care ? ?Patient Profile/HPI:  88 y.o. female  with past medical history of GERD, HTN, HLD, vit d deficiency admitted on 03/08/2022 with stroke- received TNK and had thrombectomy with stent placement. Extubated on 4/15. She remained lethargic after extubation. On 4/17 went into respiratory failure- probably aspiration pneumonia and required reintubation. She is having A fib with RVR. Palliative consulted for [place].  ? ?Subjective: ?Chart reviewed including labs, progress notes. Discussed with bedside RN Loma Sousa. No changes in mental status. Not following commands. On eval she opens eyes briefly to physical stimulation, no purposeful movements. She is tolerating pressure support SBT. ?Met again with all family members.  ?They had extensive discussions last night.  ?They have concluded that patient would not want to continue current path of medical interventions- she would choose to transition to comfort measures and be allowed a peaceful natural dying process with appropriate symptom management.  ?She is catholic and has been annointed with the [employer] [facility] [employer] [place]. Family is in contact with their rectory.  ?Reviewed with family the process of extubation and use of medications to ensure comfort and limit suffering with dying.  ?Encouraged family to participate in bedside rituals of keeping vigil- play music, talk to patient, share stories of loved ones.  ? ?Review of Systems  ?Unable to perform ROS: Acuity of condition  ? ? ?Physical  Exam ?Vitals and nursing note reviewed.  ?Constitutional:   ?   Appearance: She is ill-appearing.  ?Cardiovascular:  ?   Rate and Rhythm: Normal rate and regular rhythm.  ?Pulmonary:  ?   Breath sounds: Normal breath sounds.  ?   Comments: intubated ?Musculoskeletal:  ?   Comments: RUE edema  ?Neurological:  ?   Sensory: Sensory deficit present.  ?   Motor: Weakness present.  ?   Comments: Lethargic, minimally responsive  ?         ? ?Vital Signs: BP (!) 115/56   Pulse 70   Temp 98.5 ?F (36.9 ?C) (Axillary)   Resp 13   Ht 5' (1.524  m)   Wt 54 kg   SpO2 99%   BMI 23.25 kg/m?  ?SpO2: SpO2: 99 % ?O2 Device: O2 Device: Ventilator ?O2 Flow Rate: O2 Flow Rate (L/min): 2 L/min ? ?Intake/output summary:  ?Intake/Output Summary (Last 24 hours) at 03/09/2022 1047 ?Last data filed at 03/09/2022 1000 ?Gross per 24 hour  ?Intake 1586.76 ml  ?Output 1500 ml  ?Net 86.76 ml  ? ?LBM: Last BM Date :  (PTA) ?Baseline Weight: Weight: 53.2 kg ?Most recent weight: Weight: 54 kg ? ?     ?Palliative Assessment/Data: PPS: 10% ? ? ? ? ? ?Patient Active Problem List  ? Diagnosis Date Noted  ? Ventilator dependence ([place])   ? Acute ischemic left MCA stroke ([place]) 03/20/2022  ? Middle cerebral artery embolism, left 02/19/2022  ? ? ?Palliative Care Assessment & Plan  ? ? ?Assessment/Recommendations/Plan ? ?Large MCA embolic stroke s/p thrombectomy and stent, respiratory failure s/p extubation from procedure requiring reintubation, poor recovery of mental status- family has made decision to change goals of care to comfort measures only and allow for natural dying process ?Extubation orders entered ?Morphine infusion and bolus orders entered ?Lorazepam IV for anxiety or agitation ?Robinul for terminal secretions ? ? ?Code Status: ?DNR ? ?Prognosis: ? Hours - Days ? ?Discharge Planning: ?Anticipated Hospital Death ? ?Care plan was discussed with patient's family and care team.  ? ?Thank you for allowing the Palliative Medicine Team to assist in  the care of this patient. ? ?Total time: 70 minutes ?   ?Greater than 50%  of this time was spent counseling and coordinating care related to the above assessment and plan. ? ?Mariana Kaufman, AGNP-C ?Palliative Medicine ? ? ?Please contact Palliative Medicine Team phone at [phone] for questions and concerns.  ? ? ? ? ? ? ?

## 2022-03-09 NOTE — Progress Notes (Signed)
? ?NAME:  Elizabeth Brock, MRN:  [id number], DOB:  Dec 05, 1932, LOS: 5 ?ADMISSION DATE:  02/26/2022, CONSULTATION DATE:  03/08/2022 ?REFERRING MD:  Dr Alferd Patee, CHIEF COMPLAINT:  Stroke ? ?History of Present Illness:  ?88 yo female admitted with L MCA stroke, s/p TNK and thrombectomy ? ?Patient presented to the ED with complaints of right sided weakness associated with left gaze preference and right hemianopsia.  NIHSS: 24.  Stroke protocol initiated, neurology consulted, patient was found to have L M1 occlusion.  Patient received TNK and then proceeded with thrombectomy with a total of 3 passes. TICI 2B. Post procedure CTH with contrast staining.  Right femoral site closed with angio-seal  ? ?Patient evaluated in PACU. Intubated and sedated, on propofol infusion.  PERRL, + cough/gag.  Not following commands. Bilateral DP palpable and warm. No bleeding at femoral site.   ? ?Pertinent  Medical History  ?GERD ?Hypertension ?Hyperlipidemia ?Osteoporosis ?Vitamin D Deficiency ? ?Significant Hospital Events: ?Including procedures, antibiotic start and stop dates in addition to other pertinent events   ?4/14: TNK and thrombectomy. Intubated ?4/15 Waking up, tolerating SBT ?4/16: Extubated to [place] ?4/17: Re-intubated for airway protection. Palliative Care consulted.  ? ?Interim History / Subjective:  ? ?No acute overnight events. Heart rate stable in the 70s with no RVR.  ?This AM, patient opens eye intermittently to voice, but otherwise, not following commands at all.  ? ?Per family, meeting with palliative today to discuss next steps.  ? ?Objective   ?Blood pressure 134/65, pulse 75, temperature 98.5 ?F (36.9 ?C), temperature source Axillary, resp. rate (!) 22, height 5' (1.524 m), weight 54 kg, SpO2 100 %. ?   ?Vent Mode: PSV;CPAP ?FiO2 (%):  [30 %] 30 % ?Set Rate:  [16 bmp] 16 bmp ?Vt Set:  [360 mL] 360 mL ?PEEP:  [5 cmH20] 5 cmH20 ?Pressure Support:  [10 cmH20] 10 cmH20 ?Plateau Pressure:  [12 cmH20-15 cmH20] 12 cmH20   ? ?Intake/Output Summary (Last 24 hours) at 03/09/2022 0936 ?Last data filed at 03/09/2022 0800 ?Gross per 24 hour  ?Intake 1575.96 ml  ?Output 1500 ml  ?Net 75.96 ml  ? ? ?Filed Weights  ? 03/07/22 0500 03/08/22 0500 03/09/22 0500  ?Weight: 54 kg 53.8 kg 54 kg  ? ?Physical exam:  ?General: Acute on chronically ill-appearing female, lying on the bed ?HEENT: [place]/AT, eyes anicteric.  moist mucus membranes ?Neuro: Left gaze preference. Unable to follow commands. Opens eyes to voice and touch. Weak cough reflex. Absent gag reflex. Corneal reflex intact.  ?Chest: Clear to auscultation throughout. No wheezing. ?Heart: Regular rate and rhythm. No murmurs.  ?Abdomen: Soft, nontender, nondistended, bowel sounds present ?Skin: No rash ? ?Resolved Hospital Problem list   ? ? ?Assessment & Plan:  ? ?Goals of Care:  ?- Given poor functional status PTA with now neurological deficits that may preclude ability to become vent independent, palliative was consulted for goals of care discussions. Per family and patient's advanced directive, no plans for trach or peg.  ?- Family meeting today to discuss decisions regarding comfort measures and/or one-way extubation.  ? ?Acute left MCA stroke s/p TNK and thrombectomy of left M1 occlusion with stent placement ?Right MCA/ACA infarcts ?- Given wide spread nature of infarcts, concerning for cardio-embolic source. Telemetry on the AM of 4/17 consistent with paroxysmal A. Fib. No additional episodes of RVR since 4/17.  ?- Stroke team is following ?- Continue secondary stroke prophylaxis ?- Continue Aspirin and Brilinta ?- Continue Simvastatin ?- Goal SBP < 180  and DBP < 110 ? ?Acute respiratory failure with hypoxia ?- Secondary to acute neurological deficits with inability to protect airway. Required re-intubation on 4/17.  ?- Continue full vent support ?- Daily SBTs. Not a candidate for extubation though due to lack of neurological recovery. Patient has minimal cough reflex and absent gag  reflex.  ?- Per family and patient's advanced directive, no plans for trach or PEG.  ? ?Paroxysmal Atrial Fibrillation with RVR ?- Continue Amiodarone to 200 mg BID  ?- CHADs-VASc elevated at 6, however given need for DAPT and high risk for hemorrhagic conversion, will hold off on initiating AC ? ?Hypertension ?- Blood pressure at goal today. Not on any anti-hypertensives at this time.  ? ?GERD ?- Continue Protonix ? ?Best Practice (right click and "Reselect all SmartList Selections" daily)  ? ?Diet/type: NPO ?DVT prophylaxis: Subcu Lovenox ?GI prophylaxis: PPI ?Lines: N/A ?Foley: Discontinue ?Code Status:  full code ?Last date of multidisciplinary goals of care discussion [4/18 with palliative.] ? ?Labs   ?CBC: ?Recent Labs  ?Lab 03/17/2022 ?1221 03/01/2022 ?1226 03/05/22 ?0503 03/06/22 ?1003 03/07/22 ?[id number] 03/07/22 ?0919 03/08/22 ?0612 03/09/22 ?0400  ?WBC 6.5   < > 7.4 12.9*  --  12.1* 10.0 11.1*  ?NEUTROABS 3.8  --  6.0  --   --   --   --   --   ?HGB 11.7*   < > 10.4* 8.9* 8.8* 9.1* 8.4* 9.0*  ?HCT 35.6*   < > 30.0* 26.2* 26.0* 27.0* 25.6* 27.9*  ?MCV 103.2*   < > 99.7 101.6*  --  101.9* 102.4* 104.1*  ?PLT 213   < > 235 218  --  197 186 207  ? < > = values in this interval not displayed.  ? ? ? ?Basic Metabolic Panel: ?Recent Labs  ?Lab 03/05/22 ?0503 03/06/22 ?1003 03/06/22 ?1527 03/07/22 ?0856 03/07/22 ?0919 03/07/22 ?1609 03/08/22 ?0612 03/09/22 ?0400  ?NA 140 140  --  139 140  --  139 140  ?K 3.7 3.2*  --  3.9 4.0  --  3.4* 3.9  ?CL 113* 112*  --   --  110  --  108 107  ?CO2 21* 20*  --   --  24  --  26 27  ?GLUCOSE 155* 99  --   --  175*  --  179* 130*  ?BUN 13 10  --   --  13  --  15 16  ?CREATININE 0.86 0.89  --   --  0.69  --  0.67 0.63  ?CALCIUM 8.8* 8.6*  --   --  8.8*  --  8.5* 8.7*  ?MG  --   --  1.6*  --  1.7 1.7 1.7  --   ?PHOS  --   --  2.7  --  2.3* 2.6 2.6  --   ? ? ?GFR: ?Estimated Creatinine Clearance: 36.3 mL/min (by C-G formula based on SCr of 0.63 mg/dL). ?Recent Labs  ?Lab 03/06/22 ?1003  03/07/22 ?0919 03/08/22 ?0612 03/09/22 ?0400  ?WBC 12.9* 12.1* 10.0 11.1*  ? ? ? ?Liver Function Tests: ?Recent Labs  ?Lab 03/18/2022 ?1221  ?AST 15  ?ALT 10  ?ALKPHOS 69  ?BILITOT 0.6  ?PROT 6.2*  ?ALBUMIN 3.4*  ? ? ?No results for input(s): LIPASE, AMYLASE in the last 168 hours. ?No results for input(s): AMMONIA in the last 168 hours. ? ?ABG ?   ?Component Value Date/Time  ? PHART 7.469 (H) 03/07/2022 0856  ? PCO2ART 37.7 03/07/2022 0856  ?  PO2ART 449 (H) 03/07/2022 0856  ? HCO3 27.3 03/07/2022 0856  ? TCO2 28 03/07/2022 0856  ? ACIDBASEDEF 2.0 03/15/2022 2013  ? O2SAT 100 03/07/2022 0856  ? ?  ? ?Coagulation Profile: ?Recent Labs  ?Lab 02/25/2022 ?1221  ?INR 1.1  ? ? ? ?Cardiac Enzymes: ?No results for input(s): CKTOTAL, CKMB, CKMBINDEX, TROPONINI in the last 168 hours. ? ?HbA1C: ?Hgb A1c MFr Bld  ?Date/Time Value Ref Range Status  ?03/03/2022 07:51 PM 5.7 (H) 4.8 - 5.6 % Final  ?  Comment:  ?  (NOTE) ?Pre diabetes:          5.7%-6.4% ? ?Diabetes:              >6.4% ? ?Glycemic control for   <7.0% ?adults with diabetes ?  ? ? ?CBG: ?Recent Labs  ?Lab 03/08/22 ?1525 03/08/22 ?1932 03/08/22 ?2348 03/09/22 ?0330 03/09/22 ?0731  ?GLUCAP 115* 128* 136* 130* 132*  ? ? ?Dr. Jose Persia ?Internal Medicine PGY-3  ? ?03/09/2022, 9:36 AM ? ?

## 2022-03-09 NOTE — Progress Notes (Signed)
Per discussion w/ RN, family wants more time with pt before pt is extubated. Pt currently appears comfortable.  ?

## 2022-03-09 NOTE — Progress Notes (Signed)
RT note. ?Patient placed on SBT 10/5 30% sat 100% with no labored breathing noted. RT will continue to monitor.  ?

## 2022-03-09 NOTE — Progress Notes (Signed)
STROKE TEAM PROGRESS NOTE  ? ?SUBJECTIVE (INTERVAL HISTORY) ?Daughters and husband at bedside.  Patient open eyes on voice, however still has left gaze preference, not follow commands.  Still on vent, palliative care has discussed with family and family decided for comfort care measures.  We will have terminal extubation soon. ? ?OBJECTIVE ?Temp:  [98.4 ?F (36.9 ?C)-99.3 ?F (37.4 ?C)] 98.8 ?F (37.1 ?C) (04/19 1200) ?Pulse Rate:  [70-95] 83 (04/19 1400) ?Cardiac Rhythm: Normal sinus rhythm (04/19 0800) ?Resp:  [13-22] 19 (04/19 1400) ?BP: (114-146)/(56-75) 137/66 (04/19 1400) ?SpO2:  [97 %-100 %] 99 % (04/19 1400) ?FiO2 (%):  [30 %] 30 % (04/19 1055) ?Weight:  [54 kg] 54 kg (04/19 0500) ? ?Recent Labs  ?Lab 03/08/22 ?1932 03/08/22 ?2348 03/09/22 ?0330 03/09/22 ?[id number] 03/09/22 ?1107  ?GLUCAP 128* 136* 130* 132* 132*  ? ?Recent Labs  ?Lab 03/05/22 ?0503 03/06/22 ?1003 03/06/22 ?1527 03/07/22 ?0856 03/07/22 ?0919 03/07/22 ?1609 03/08/22 ?0612 03/09/22 ?0400  ?NA 140 140  --  139 140  --  139 140  ?K 3.7 3.2*  --  3.9 4.0  --  3.4* 3.9  ?CL 113* 112*  --   --  110  --  108 107  ?CO2 21* 20*  --   --  24  --  26 27  ?GLUCOSE 155* 99  --   --  175*  --  179* 130*  ?BUN 13 10  --   --  13  --  15 16  ?CREATININE 0.86 0.89  --   --  0.69  --  0.67 0.63  ?CALCIUM 8.8* 8.6*  --   --  8.8*  --  8.5* 8.7*  ?MG  --   --  1.6*  --  1.7 1.7 1.7  --   ?PHOS  --   --  2.7  --  2.3* 2.6 2.6  --   ? ?Recent Labs  ?Lab 03/14/2022 ?1221  ?AST 15  ?ALT 10  ?ALKPHOS 69  ?BILITOT 0.6  ?PROT 6.2*  ?ALBUMIN 3.4*  ? ?Recent Labs  ?Lab 03/19/2022 ?1221 03/18/2022 ?1226 03/05/22 ?0503 03/06/22 ?1003 03/07/22 ?[id number] 03/07/22 ?0919 03/08/22 ?0612 03/09/22 ?0400  ?WBC 6.5   < > 7.4 12.9*  --  12.1* 10.0 11.1*  ?NEUTROABS 3.8  --  6.0  --   --   --   --   --   ?HGB 11.7*   < > 10.4* 8.9* 8.8* 9.1* 8.4* 9.0*  ?HCT 35.6*   < > 30.0* 26.2* 26.0* 27.0* 25.6* 27.9*  ?MCV 103.2*   < > 99.7 101.6*  --  101.9* 102.4* 104.1*  ?PLT 213   < > 235 218  --  197 186 207   ? < > = values in this interval not displayed.  ? ?No results for input(s): CKTOTAL, CKMB, CKMBINDEX, TROPONINI in the last 168 hours. ?No results for input(s): LABPROT, INR in the last 72 hours. ? ?No results for input(s): COLORURINE, LABSPEC, [place], GLUCOSEU, HGBUR, BILIRUBINUR, KETONESUR, PROTEINUR, UROBILINOGEN, NITRITE, LEUKOCYTESUR in the last 72 hours. ? ?Invalid input(s): APPERANCEUR  ?   ?Component Value Date/Time  ? CHOL 125 03/05/2022 0503  ? TRIG 46 03/08/2022 0612  ? HDL 45 03/05/2022 0503  ? CHOLHDL 2.8 03/05/2022 0503  ? VLDL 20 03/05/2022 0503  ? [place] 60 03/05/2022 0503  ? ?Lab Results  ?Component Value Date  ? HGBA1C 5.7 (H) 03/09/2022  ? ?   ?Component Value Date/Time  ? LABOPIA NONE DETECTED 03/05/2022  1017  ? COCAINSCRNUR NONE DETECTED 03/05/2022 1017  ? LABBENZ NONE DETECTED 03/05/2022 1017  ? AMPHETMU NONE DETECTED 03/05/2022 1017  ? THCU NONE DETECTED 03/05/2022 1017  ? LABBARB NONE DETECTED 03/05/2022 1017  ?  ?Recent Labs  ?Lab 03/05/2022 ?1221  ?ETH <10  ? ? ?I have personally reviewed the radiological images below and agree with the radiology interpretations. ? ?DG Abd 1 View ? ?Result Date: 03/06/2022 ?CLINICAL DATA:  Nasogastric tube placement. EXAM: ABDOMEN - 1 VIEW COMPARISON:  03/05/2022 FINDINGS: Nasogastric tube remains in place with tip overlying the gastric antrum. No evidence of dilated bowel loops. IMPRESSION: Nasogastric tube tip overlies the gastric antrum. Electronically Signed   By: Marlaine Hind M.D.   On: 03/06/2022 09:10  ? ?DG Abd 1 View ? ?Result Date: 03/05/2022 ?CLINICAL DATA:  OG tube placement EXAM: ABDOMEN - 1 VIEW COMPARISON:  None. FINDINGS: Esophageal tube tip overlies the gastric body. Atelectasis or scar at the left base. Visible gas pattern is unobstructed IMPRESSION: Esophageal tube tip overlies the body of the stomach Electronically Signed   By: Donavan Foil M.D.   On: 03/05/2022 19:53  ? ?MR ANGIO HEAD WO CONTRAST ? ?Result Date: 03/05/2022 ?CLINICAL  DATA:  Stroke, follow-up. Status post revascularization of left M1 occlusion. EXAM: MRA HEAD WITHOUT CONTRAST TECHNIQUE: Angiographic images of the Circle of Willis were acquired using MRA technique without intravenous contrast. FINDINGS: Anterior circulation: The right internal carotid artery, A1 segment and M1 segments are normal. Right ACA and MCA branch vessels are normal. The left M1 segment remains patent with signal to the stent. Artifact noted at the stent. Anterior branch vessels are not seen. Posterior circulation: The left vertebral artery is dominant. Basilar artery is within normal limits. Posterior cerebral arteries originate from the basilar tip. PCA branch vessels are within normal limits. Anatomic variants: None Other: None. IMPRESSION: 1. Patent left M1 segment with signal to the stent. 2. Posterior branches distal to the stent less prominent than on the previous study. 3. Persistent occlusion of anterior branches. 4. Right MCA, bilateral ACA, and posterior circulation within normal limits. Electronically Signed   By: San Morelle M.D.   On: 03/05/2022 15:04  ? ?MR BRAIN WO CONTRAST ? ?Result Date: 03/05/2022 ?CLINICAL DATA:  Neuro deficit, acute, stroke suspected. Sudden onset right-sided weakness, left gaze deviation and right hemianopsia. Patient was found unresponsive. EXAM: MRI HEAD WITHOUT CONTRAST TECHNIQUE: Multiplanar, multiecho pulse sequences of the brain and surrounding structures were obtained without intravenous contrast. COMPARISON:  CT head without contrast 03/15/2022 and CTA head and neck 02/21/2022 FINDINGS: Brain: Restricted diffusion is present with acute nonhemorrhagic infarct involving the left lentiform nucleus, the anterior insular cortex, left corona radiata and scattered areas in the high left frontal and parietal cortex. A single cortical infarct in the anterior right frontal lobe measures 6 mm on image 39 of series 2. A separate posterior MCA territory infarct  involves the posterior left parietal and temporal lobe, potentially near the watershed. T2 hyperintensities are associated with these areas of acute/subacute infarction. Diffuse periventricular and subcortical white matter changes are also present bilaterally. White matter changes extend into the brainstem. Remote infarcts are present posterior cerebellum bilaterally without hemorrhage. The ventricles are of normal size. No significant extraaxial fluid collection is present. The internal auditory canals are within normal limits. Vascular: Flow is present in the major intracranial arteries. Skull and upper cervical spine: The craniocervical junction is normal. Upper cervical spine is within normal limits. Marrow signal is  unremarkable. Sinuses/Orbits: Right sphenoid sinus is opacified. The paranasal sinuses and mastoid air cells are otherwise clear. Bilateral lens replacements are noted. Globes and orbits are otherwise unremarkable. IMPRESSION: 1. Acute/subacute nonhemorrhagic infarct involving the left lentiform nucleus, anterior insular cortex, left corona radiata, and scattered areas in the high left frontal and parietal cortex. 2. Additional posterior left MCA territory infarct, potentially near the watershed involving the posterior left parietal lobe and temporal lobe. 3. Single 6 mm cortical infarct in the anterior right frontal lobe. 4. Remote infarcts of the posterior cerebellum bilaterally. 5. Diffuse periventricular and subcortical white matter disease bilaterally likely reflects the sequela of chronic microvascular ischemia. Electronically Signed   By: San Morelle M.D.   On: 03/05/2022 14:58  ? ?[place] ? ?Result Date: 03/08/2022 ?INDICATION: New onset global aphasia and right-sided hemiplegia and left gaze deviation. Occluded left middle cerebral artery M1 segment on CT angiogram of the head and neck. EXAM: 1. EMERGENT LARGE VESSEL OCCLUSION THROMBOLYSIS (anterior CIRCULATION) COMPARISON:   CT angiogram of the head and neck of March 04, 2022. MEDICATIONS: The antibiotic was administered within 1 hour of the procedure. ANESTHESIA/SEDATION: General anesthesia. CONTRAST:  Omnipaque 300 approximate

## 2022-03-09 NOTE — Progress Notes (Signed)
SLP Cancellation Note ? ?Patient Details ?Name: Elizabeth Brock ?MRN: [id number] ?DOB: 1932-12-28 ? ? ?Cancelled treatment:       Reason Eval/Treat Not Completed: Medical issues which prohibited therapy. Pt continues on vent. Notes from Palliative Care meeting as follows: ?continue current full scope care, DNR, family to discuss transition to comfort vs ongoing aggressive care- she does have a living will so would not recommend trach, PEG ?Plan to reconvene tomorrow at 10am for further discussion after family has time to discuss options ?SLP will continue to follow for appropriateness. ? ?Traquan Duarte B. Mechelle Pates, MSP, CCC-SLP ?Speech Language Pathologist ?Office: [phone] ? ?Elizabeth Brock ?03/09/2022, 9:05 AM ?

## 2022-03-09 NOTE — Procedures (Signed)
Extubation Procedure Note ? ?Patient Details:   ?Name: Elizabeth Brock ?DOB: June 05, 1933 ?MRN: [id number] ?  ?Airway Documentation:  ?  ?Vent end date: 03/09/22 Vent end time: 1605  ?+ cuff leak test prior to extubation.   ?Evaluation ? O2 sats: stable throughout ?Complications: No apparent complications ?Patient did tolerate procedure well. ?Bilateral Breath Sounds: Diminished, Clear ?  ?No pt not able to speak, pt was extubated per withdrawal of life support. RN and  Family at bedside.  Pt appeared to be comfortable t/o procedure.   ? ?Jennette Kettle ?03/09/2022, 4:13 PM ? ?

## 2022-03-10 DIAGNOSIS — I63512 Cerebral infarction due to unspecified occlusion or stenosis of left middle cerebral artery: Secondary | ICD-10-CM | POA: Diagnosis not present

## 2022-03-10 DIAGNOSIS — Z515 Encounter for palliative care: Secondary | ICD-10-CM | POA: Diagnosis not present

## 2022-03-10 DIAGNOSIS — I6602 Occlusion and stenosis of left middle cerebral artery: Secondary | ICD-10-CM | POA: Diagnosis not present

## 2022-03-10 NOTE — Progress Notes (Signed)
Chart reviewed, patient and her family members had GOC meeting yesterday and decided on comfort care.  ? ?NIR will sign off but is available for further assistant if needed. ? Please call NIR for questions and concerns.  ? ? ?Willette Brace PA-C ?03/10/2022 8:29 AM ? ? ? ? ?

## 2022-03-10 NOTE — Progress Notes (Addendum)
Patient [id number] A Dilley      DOB: 1933/06/19      [id number] ? ? ? ?  ?Palliative Medicine Team ? ? ? ?Subjective: Bedside symptom check completed. Two daughters bedside at time of visit.  ? ? ?Physical exam: Patient resting in bed with eyes closed at time of visit. Open mouth breathing even and non-labored, no excessive secretions noted. Patient without physical or non-verbal signs of pain or discomfort at this time. Patient's extremities warm with distal pulse strong. Patient's color slightly pale. ? ? ?Assessment and plan: This RN spoke with bedside RN, without any concerns or needs. All questions answered for family at this time. Plan at this time to stay in hospital for death. Will continue to follow for any changes or advances.  ? ? ?Thank you for allowing the Palliative Medicine Team to assist in the care of this patient. ?  ?  ?Damian Leavell, MSN, RN ?Palliative Medicine Team ?Team Phone: [phone]  ?This phone is monitored 7a-7p, please reach out to attending physician outside of these hours for urgent needs.   ?

## 2022-03-10 NOTE — Progress Notes (Signed)
? ?                                                                                                                                                     ?                                                   ?Daily Progress Note  ? ?Patient Name: Elizabeth Brock       Date: 03/10/2022 ?DOB: Dec 29, 1932  Age: 88 y.o. MRN#: [id number] ?Attending Physician: Stroke, Md, MD ?Primary Care Physician: Ileana Ladd, MD ?Admit Date: 02/25/2022 ? ?Reason for Consultation/Follow-up: Establishing goals of care ? ?Patient Profile/HPI:  88 y.o. female  with past medical history of GERD, HTN, HLD, vit d deficiency admitted on 02/24/2022 with stroke- received TNK and had thrombectomy with stent placement. Extubated on 4/15. She remained lethargic after extubation. On 4/17 went into respiratory failure- probably aspiration pneumonia and required reintubation. She is having A fib with RVR. Palliative consulted for GOC.  ? ?Subjective: ?Chart reviewed progress notes, medication useage. IV morphine infusing, rate has been increased several times, but few bolus doses have been given.  ?On evaluation- patient appears comfortable, but respiration rate is increased. Family notes that when they arrived this morning Jagger was moaning and respirations were labored.  ?Answered questions related to symptom management and dying process  ? ?Review of Systems  ?Unable to perform ROS: Acuity of condition  ? ? ?Physical Exam ?Vitals and nursing note reviewed.  ?Constitutional:   ?   Appearance: She is ill-appearing.  ?Cardiovascular:  ?   Rate and Rhythm: Normal rate and regular rhythm.  ?Pulmonary:  ?   Comments: shallow ?Musculoskeletal:  ?   Comments: RUE edema  ?Neurological:  ?   Sensory: Sensory deficit present.  ?   Motor: Weakness present.  ?   Comments: Lethargic, unresponsive  ?         ? ?Vital Signs: BP (!) 82/46 (BP Location: Left Arm)   Pulse (!) 109   Temp 98.9 ?F (37.2 ?C) (Oral)   Resp 19   Ht 5' (1.524 m)   Wt 54 kg   SpO2 (!) 84%    BMI 23.25 kg/m?  ?SpO2: SpO2: (!) 84 % ?O2 Device: O2 Device: Room Air ?O2 Flow Rate: O2 Flow Rate (L/min): 2 L/min ? ?Intake/output summary:  ?Intake/Output Summary (Last 24 hours) at 03/10/2022 1450 ?Last data filed at 03/10/2022 3785 ?Gross per 24 hour  ?Intake 199.19 ml  ?Output 200 ml  ?Net -0.81 ml  ? ? ?LBM: Last BM Date :  (PTA) ?Baseline Weight: Weight: 53.2 kg ?Most recent weight: Weight: 54 kg ? ?     ?  Palliative Assessment/Data: PPS: 10% ? ? ? ? ? ?Patient Active Problem List  ? Diagnosis Date Noted  ? Ventilator dependence (HCC)   ? Acute ischemic left MCA stroke (HCC) March 14, 2022  ? Middle cerebral artery embolism, left 2022-03-14  ? ? ?Palliative Care Assessment & Plan  ? ? ?Assessment/Recommendations/Plan ? ?Continue current comfort measures ?Discussed symptom management with nursing- please give bolus doses q15 min prn prior to increasing rate- please be sure to check back on patient for comfort after providing bolus dose ? ? ?Code Status: ?DNR ? ?Prognosis: ? Hours - Days ? ?Discharge Planning: ?Anticipated Hospital Death ? ?Care plan was discussed with patient's family and care team.  ? ?Thank you for allowing the Palliative Medicine Team to assist in the care of this patient. ? ? ? ?Ocie Bob, AGNP-C ?Palliative Medicine ? ? ?Please contact Palliative Medicine Team phone at [phone] for questions and concerns.  ? ? ? ? ? ? ?

## 2022-03-10 NOTE — Progress Notes (Signed)
STROKE TEAM PROGRESS NOTE  ? ?SUBJECTIVE (INTERVAL HISTORY) ?Large numbers of family are at the bedside. Pt lying in bed, unconscious, but not in distress. Comfort care continues.  ? ?OBJECTIVE ?Temp:  [98.9 ?F (37.2 ?C)-99.2 ?F (37.3 ?C)] 98.9 ?F (37.2 ?C) (04/20 0809) ?Pulse Rate:  [83-120] 109 (04/20 0809) ?Cardiac Rhythm: Sinus tachycardia (04/19 2000) ?Resp:  [12-21] 19 (04/20 0809) ?BP: (82-137)/(46-68) 82/46 (04/20 0809) ?SpO2:  [83 %-100 %] 84 % (04/20 0809) ? ?Recent Labs  ?Lab 03/08/22 ?1932 03/08/22 ?2348 03/09/22 ?0330 03/09/22 ?[id number] 03/09/22 ?1107  ?GLUCAP 128* 136* 130* 132* 132*  ? ?Recent Labs  ?Lab 03/05/22 ?0503 03/06/22 ?1003 03/06/22 ?1527 03/07/22 ?0856 03/07/22 ?0919 03/07/22 ?1609 03/08/22 ?0612 03/09/22 ?0400  ?NA 140 140  --  139 140  --  139 140  ?K 3.7 3.2*  --  3.9 4.0  --  3.4* 3.9  ?CL 113* 112*  --   --  110  --  108 107  ?CO2 21* 20*  --   --  24  --  26 27  ?GLUCOSE 155* 99  --   --  175*  --  179* 130*  ?BUN 13 10  --   --  13  --  15 16  ?CREATININE 0.86 0.89  --   --  0.69  --  0.67 0.63  ?CALCIUM 8.8* 8.6*  --   --  8.8*  --  8.5* 8.7*  ?MG  --   --  1.6*  --  1.7 1.7 1.7  --   ?PHOS  --   --  2.7  --  2.3* 2.6 2.6  --   ? ?Recent Labs  ?Lab 02/23/2022 ?1221  ?AST 15  ?ALT 10  ?ALKPHOS 69  ?BILITOT 0.6  ?PROT 6.2*  ?ALBUMIN 3.4*  ? ?Recent Labs  ?Lab 03/02/2022 ?1221 03/12/2022 ?1226 03/05/22 ?0503 03/06/22 ?1003 03/07/22 ?[id number] 03/07/22 ?0919 03/08/22 ?0612 03/09/22 ?0400  ?WBC 6.5   < > 7.4 12.9*  --  12.1* 10.0 11.1*  ?NEUTROABS 3.8  --  6.0  --   --   --   --   --   ?HGB 11.7*   < > 10.4* 8.9* 8.8* 9.1* 8.4* 9.0*  ?HCT 35.6*   < > 30.0* 26.2* 26.0* 27.0* 25.6* 27.9*  ?MCV 103.2*   < > 99.7 101.6*  --  101.9* 102.4* 104.1*  ?PLT 213   < > 235 218  --  197 186 207  ? < > = values in this interval not displayed.  ? ?No results for input(s): CKTOTAL, CKMB, CKMBINDEX, TROPONINI in the last 168 hours. ?No results for input(s): LABPROT, INR in the last 72 hours. ? ?No results for input(s):  COLORURINE, LABSPEC, [place], GLUCOSEU, HGBUR, BILIRUBINUR, KETONESUR, PROTEINUR, UROBILINOGEN, NITRITE, LEUKOCYTESUR in the last 72 hours. ? ?Invalid input(s): APPERANCEUR  ?   ?Component Value Date/Time  ? CHOL 125 03/05/2022 0503  ? TRIG 46 03/08/2022 0612  ? HDL 45 03/05/2022 0503  ? CHOLHDL 2.8 03/05/2022 0503  ? VLDL 20 03/05/2022 0503  ? [place] 60 03/05/2022 0503  ? ?Lab Results  ?Component Value Date  ? HGBA1C 5.7 (H) 03/17/2022  ? ?   ?Component Value Date/Time  ? LABOPIA NONE DETECTED 03/05/2022 1017  ? COCAINSCRNUR NONE DETECTED 03/05/2022 1017  ? LABBENZ NONE DETECTED 03/05/2022 1017  ? AMPHETMU NONE DETECTED 03/05/2022 1017  ? THCU NONE DETECTED 03/05/2022 1017  ? LABBARB NONE DETECTED 03/05/2022 1017  ?  ?Recent Labs  ?  Lab 03/12/2022 ?1221  ?ETH <10  ? ? ?I have personally reviewed the radiological images below and agree with the radiology interpretations. ? ?DG Abd 1 View ? ?Result Date: 03/06/2022 ?CLINICAL DATA:  Nasogastric tube placement. EXAM: ABDOMEN - 1 VIEW COMPARISON:  03/05/2022 FINDINGS: Nasogastric tube remains in place with tip overlying the gastric antrum. No evidence of dilated bowel loops. IMPRESSION: Nasogastric tube tip overlies the gastric antrum. Electronically Signed   By: Marlaine Hind M.D.   On: 03/06/2022 09:10  ? ?DG Abd 1 View ? ?Result Date: 03/05/2022 ?CLINICAL DATA:  OG tube placement EXAM: ABDOMEN - 1 VIEW COMPARISON:  None. FINDINGS: Esophageal tube tip overlies the gastric body. Atelectasis or scar at the left base. Visible gas pattern is unobstructed IMPRESSION: Esophageal tube tip overlies the body of the stomach Electronically Signed   By: Donavan Foil M.D.   On: 03/05/2022 19:53  ? ?MR ANGIO HEAD WO CONTRAST ? ?Result Date: 03/05/2022 ?CLINICAL DATA:  Stroke, follow-up. Status post revascularization of left M1 occlusion. EXAM: MRA HEAD WITHOUT CONTRAST TECHNIQUE: Angiographic images of the Circle of Willis were acquired using MRA technique without intravenous contrast.  FINDINGS: Anterior circulation: The right internal carotid artery, A1 segment and M1 segments are normal. Right ACA and MCA branch vessels are normal. The left M1 segment remains patent with signal to the stent. Artifact noted at the stent. Anterior branch vessels are not seen. Posterior circulation: The left vertebral artery is dominant. Basilar artery is within normal limits. Posterior cerebral arteries originate from the basilar tip. PCA branch vessels are within normal limits. Anatomic variants: None Other: None. IMPRESSION: 1. Patent left M1 segment with signal to the stent. 2. Posterior branches distal to the stent less prominent than on the previous study. 3. Persistent occlusion of anterior branches. 4. Right MCA, bilateral ACA, and posterior circulation within normal limits. Electronically Signed   By: San Morelle M.D.   On: 03/05/2022 15:04  ? ?MR BRAIN WO CONTRAST ? ?Result Date: 03/05/2022 ?CLINICAL DATA:  Neuro deficit, acute, stroke suspected. Sudden onset right-sided weakness, left gaze deviation and right hemianopsia. Patient was found unresponsive. EXAM: MRI HEAD WITHOUT CONTRAST TECHNIQUE: Multiplanar, multiecho pulse sequences of the brain and surrounding structures were obtained without intravenous contrast. COMPARISON:  CT head without contrast 02/23/2022 and CTA head and neck 03/15/2022 FINDINGS: Brain: Restricted diffusion is present with acute nonhemorrhagic infarct involving the left lentiform nucleus, the anterior insular cortex, left corona radiata and scattered areas in the high left frontal and parietal cortex. A single cortical infarct in the anterior right frontal lobe measures 6 mm on image 39 of series 2. A separate posterior MCA territory infarct involves the posterior left parietal and temporal lobe, potentially near the watershed. T2 hyperintensities are associated with these areas of acute/subacute infarction. Diffuse periventricular and subcortical white matter changes  are also present bilaterally. White matter changes extend into the brainstem. Remote infarcts are present posterior cerebellum bilaterally without hemorrhage. The ventricles are of normal size. No significant extraaxial fluid collection is present. The internal auditory canals are within normal limits. Vascular: Flow is present in the major intracranial arteries. Skull and upper cervical spine: The craniocervical junction is normal. Upper cervical spine is within normal limits. Marrow signal is unremarkable. Sinuses/Orbits: Right sphenoid sinus is opacified. The paranasal sinuses and mastoid air cells are otherwise clear. Bilateral lens replacements are noted. Globes and orbits are otherwise unremarkable. IMPRESSION: 1. Acute/subacute nonhemorrhagic infarct involving the left lentiform nucleus, anterior insular cortex, left  corona radiata, and scattered areas in the high left frontal and parietal cortex. 2. Additional posterior left MCA territory infarct, potentially near the watershed involving the posterior left parietal lobe and temporal lobe. 3. Single 6 mm cortical infarct in the anterior right frontal lobe. 4. Remote infarcts of the posterior cerebellum bilaterally. 5. Diffuse periventricular and subcortical white matter disease bilaterally likely reflects the sequela of chronic microvascular ischemia. Electronically Signed   By: San Morelle M.D.   On: 03/05/2022 14:58  ? ?[place] ? ?Result Date: 03/08/2022 ?INDICATION: New onset global aphasia and right-sided hemiplegia and left gaze deviation. Occluded left middle cerebral artery M1 segment on CT angiogram of the head and neck. EXAM: 1. EMERGENT LARGE VESSEL OCCLUSION THROMBOLYSIS (anterior CIRCULATION) COMPARISON:  CT angiogram of the head and neck of March 04, 2022. MEDICATIONS: The antibiotic was administered within 1 hour of the procedure. ANESTHESIA/SEDATION: General anesthesia. CONTRAST:  Omnipaque 300 approximately 150 mL. FLUOROSCOPY  TIME:  Fluoroscopy Time: 112 minutes 54 seconds (2955 mGy). COMPLICATIONS: None immediate. TECHNIQUE: Following a full explanation of the procedure along with the potential associated complications, an

## 2022-03-10 NOTE — Care Management Important Message (Signed)
Important Message ? ?Patient Details  ?Name: Elizabeth Brock ?MRN: [id number] ?Date of Birth: [birth year] ? ? ?Medicare Important Message Given:  Yes ? ? ? ? ?Lakin Rhine ?03/10/2022, 2:32 PM ?

## 2022-03-11 DIAGNOSIS — I63512 Cerebral infarction due to unspecified occlusion or stenosis of left middle cerebral artery: Secondary | ICD-10-CM | POA: Diagnosis not present

## 2022-03-11 NOTE — Progress Notes (Signed)
Patient Elizabeth Brock      DOB: 19-Jan-1933      RAQ:[id number] ? ? ? ?  ?Palliative Medicine Team ? ? ? ?Subjective: Bedside symptom check completed. Multiple family members present at time of visit. ? ? ?Physical exam: Patient resting in bed with eyes closed at time of visit. Open mouth breathing even and non-labored, no excessive secretions noted. Patient without physical or non-verbal signs of pain or discomfort at this time. Patient less responsive, with less non-purposeful movement noted, progressing toward EOL.  ? ? ?Assessment and plan: Family without any needs or concerns this morning. This RN continued to educate on assessment findings and patient's natural progression toward EOL. All questions answered. This RN touched base with bedside RN Raven, without any needs at this time. Anticipate in hospital death in the near future. Will continue to follow for any changes or advances.  ? ? ?Thank you for allowing the Palliative Medicine Team to assist in the care of this patient. ?  ?  ?Shelda Jakes, MSN, RN ?Palliative Medicine Team ?Team Phone: [phone]  ?This phone is monitored 7a-7p, please reach out to attending physician outside of these hours for urgent needs.   ?

## 2022-03-11 NOTE — Progress Notes (Signed)
Nutrition Brief Note ? ?Chart reviewed. Pt now transitioned to comfort care. No further nutrition interventions planned at this time.  ?Please re-consult as needed.  ? ?Maisen Klingler P., RD, LDN, CNSC ?See AMiON for contact information  ? ? ?

## 2022-03-11 NOTE — Progress Notes (Addendum)
STROKE TEAM PROGRESS NOTE  ? ?SUBJECTIVE (INTERVAL HISTORY) ?Family is at the bedside. Pt lying in bed, unconscious, no acute distress. Comfort care continues. No needs expressed by patient, family, or bedside RN. Continue support. ? ?OBJECTIVE ?Temp:  [99.2 ?F (37.3 ?C)] 99.2 ?F (37.3 ?C) (04/21 0902) ?Pulse Rate:  [157] 157 (04/21 0902) ?Resp:  [18] 18 (04/21 0902) ?BP: (99)/(54) 99/54 (04/21 0902) ?SpO2:  [76 %] 76 % (04/21 0902) ? ?Recent Labs  ?Lab 03/08/22 ?1932 03/08/22 ?2348 03/09/22 ?0330 03/09/22 ?[id number] 03/09/22 ?1107  ?GLUCAP 128* 136* 130* 132* 132*  ? ? ?Recent Labs  ?Lab 03/05/22 ?0503 03/06/22 ?1003 03/06/22 ?1527 03/07/22 ?0856 03/07/22 ?0919 03/07/22 ?1609 03/08/22 ?0612 03/09/22 ?0400  ?NA 140 140  --  139 140  --  139 140  ?K 3.7 3.2*  --  3.9 4.0  --  3.4* 3.9  ?CL 113* 112*  --   --  110  --  108 107  ?CO2 21* 20*  --   --  24  --  26 27  ?GLUCOSE 155* 99  --   --  175*  --  179* 130*  ?BUN 13 10  --   --  13  --  15 16  ?CREATININE 0.86 0.89  --   --  0.69  --  0.67 0.63  ?CALCIUM 8.8* 8.6*  --   --  8.8*  --  8.5* 8.7*  ?MG  --   --  1.6*  --  1.7 1.7 1.7  --   ?PHOS  --   --  2.7  --  2.3* 2.6 2.6  --   ? ? ?Recent Labs  ?Lab 02/19/2022 ?1221  ?AST 15  ?ALT 10  ?ALKPHOS 69  ?BILITOT 0.6  ?PROT 6.2*  ?ALBUMIN 3.4*  ? ? ?Recent Labs  ?Lab 03/15/2022 ?1221 02/23/2022 ?1226 03/05/22 ?0503 03/06/22 ?1003 03/07/22 ?[id number] 03/07/22 ?0919 03/08/22 ?0612 03/09/22 ?0400  ?WBC 6.5   < > 7.4 12.9*  --  12.1* 10.0 11.1*  ?NEUTROABS 3.8  --  6.0  --   --   --   --   --   ?HGB 11.7*   < > 10.4* 8.9* 8.8* 9.1* 8.4* 9.0*  ?HCT 35.6*   < > 30.0* 26.2* 26.0* 27.0* 25.6* 27.9*  ?MCV 103.2*   < > 99.7 101.6*  --  101.9* 102.4* 104.1*  ?PLT 213   < > 235 218  --  197 186 207  ? < > = values in this interval not displayed.  ? ? ?No results for input(s): CKTOTAL, CKMB, CKMBINDEX, TROPONINI in the last 168 hours. ?No results for input(s): LABPROT, INR in the last 72 hours. ? ?No results for input(s): COLORURINE, LABSPEC,  [place], GLUCOSEU, HGBUR, BILIRUBINUR, KETONESUR, PROTEINUR, UROBILINOGEN, NITRITE, LEUKOCYTESUR in the last 72 hours. ? ?Invalid input(s): APPERANCEUR  ?   ?Component Value Date/Time  ? CHOL 125 03/05/2022 0503  ? TRIG 46 03/08/2022 0612  ? HDL 45 03/05/2022 0503  ? CHOLHDL 2.8 03/05/2022 0503  ? VLDL 20 03/05/2022 0503  ? [place] 60 03/05/2022 0503  ? ?Lab Results  ?Component Value Date  ? HGBA1C 5.7 (H) 02/28/2022  ? ?   ?Component Value Date/Time  ? LABOPIA NONE DETECTED 03/05/2022 1017  ? COCAINSCRNUR NONE DETECTED 03/05/2022 1017  ? LABBENZ NONE DETECTED 03/05/2022 1017  ? AMPHETMU NONE DETECTED 03/05/2022 1017  ? THCU NONE DETECTED 03/05/2022 1017  ? LABBARB NONE DETECTED 03/05/2022 1017  ?  ?Recent Labs  ?  Lab 03/14/2022 ?1221  ?ETH <10  ? ? ? ?I have personally reviewed the radiological images below and agree with the radiology interpretations. ? ?DG Abd 1 View ? ?Result Date: 03/06/2022 ?CLINICAL DATA:  Nasogastric tube placement. EXAM: ABDOMEN - 1 VIEW COMPARISON:  03/05/2022 FINDINGS: Nasogastric tube remains in place with tip overlying the gastric antrum. No evidence of dilated bowel loops. IMPRESSION: Nasogastric tube tip overlies the gastric antrum. Electronically Signed   By: Marlaine Hind M.D.   On: 03/06/2022 09:10  ? ?DG Abd 1 View ? ?Result Date: 03/05/2022 ?CLINICAL DATA:  OG tube placement EXAM: ABDOMEN - 1 VIEW COMPARISON:  None. FINDINGS: Esophageal tube tip overlies the gastric body. Atelectasis or scar at the left base. Visible gas pattern is unobstructed IMPRESSION: Esophageal tube tip overlies the body of the stomach Electronically Signed   By: Donavan Foil M.D.   On: 03/05/2022 19:53  ? ?MR ANGIO HEAD WO CONTRAST ? ?Result Date: 03/05/2022 ?CLINICAL DATA:  Stroke, follow-up. Status post revascularization of left M1 occlusion. EXAM: MRA HEAD WITHOUT CONTRAST TECHNIQUE: Angiographic images of the Circle of Willis were acquired using MRA technique without intravenous contrast. FINDINGS: Anterior  circulation: The right internal carotid artery, A1 segment and M1 segments are normal. Right ACA and MCA branch vessels are normal. The left M1 segment remains patent with signal to the stent. Artifact noted at the stent. Anterior branch vessels are not seen. Posterior circulation: The left vertebral artery is dominant. Basilar artery is within normal limits. Posterior cerebral arteries originate from the basilar tip. PCA branch vessels are within normal limits. Anatomic variants: None Other: None. IMPRESSION: 1. Patent left M1 segment with signal to the stent. 2. Posterior branches distal to the stent less prominent than on the previous study. 3. Persistent occlusion of anterior branches. 4. Right MCA, bilateral ACA, and posterior circulation within normal limits. Electronically Signed   By: San Morelle M.D.   On: 03/05/2022 15:04  ? ?MR BRAIN WO CONTRAST ? ?Result Date: 03/05/2022 ?CLINICAL DATA:  Neuro deficit, acute, stroke suspected. Sudden onset right-sided weakness, left gaze deviation and right hemianopsia. Patient was found unresponsive. EXAM: MRI HEAD WITHOUT CONTRAST TECHNIQUE: Multiplanar, multiecho pulse sequences of the brain and surrounding structures were obtained without intravenous contrast. COMPARISON:  CT head without contrast 03/10/2022 and CTA head and neck 02/23/2022 FINDINGS: Brain: Restricted diffusion is present with acute nonhemorrhagic infarct involving the left lentiform nucleus, the anterior insular cortex, left corona radiata and scattered areas in the high left frontal and parietal cortex. A single cortical infarct in the anterior right frontal lobe measures 6 mm on image 39 of series 2. A separate posterior MCA territory infarct involves the posterior left parietal and temporal lobe, potentially near the watershed. T2 hyperintensities are associated with these areas of acute/subacute infarction. Diffuse periventricular and subcortical white matter changes are also present  bilaterally. White matter changes extend into the brainstem. Remote infarcts are present posterior cerebellum bilaterally without hemorrhage. The ventricles are of normal size. No significant extraaxial fluid collection is present. The internal auditory canals are within normal limits. Vascular: Flow is present in the major intracranial arteries. Skull and upper cervical spine: The craniocervical junction is normal. Upper cervical spine is within normal limits. Marrow signal is unremarkable. Sinuses/Orbits: Right sphenoid sinus is opacified. The paranasal sinuses and mastoid air cells are otherwise clear. Bilateral lens replacements are noted. Globes and orbits are otherwise unremarkable. IMPRESSION: 1. Acute/subacute nonhemorrhagic infarct involving the left lentiform nucleus, anterior insular cortex,  left corona radiata, and scattered areas in the high left frontal and parietal cortex. 2. Additional posterior left MCA territory infarct, potentially near the watershed involving the posterior left parietal lobe and temporal lobe. 3. Single 6 mm cortical infarct in the anterior right frontal lobe. 4. Remote infarcts of the posterior cerebellum bilaterally. 5. Diffuse periventricular and subcortical white matter disease bilaterally likely reflects the sequela of chronic microvascular ischemia. Electronically Signed   By: San Morelle M.D.   On: 03/05/2022 14:58  ? ?[place] ? ?Result Date: 03/08/2022 ?INDICATION: New onset global aphasia and right-sided hemiplegia and left gaze deviation. Occluded left middle cerebral artery M1 segment on CT angiogram of the head and neck. EXAM: 1. EMERGENT LARGE VESSEL OCCLUSION THROMBOLYSIS (anterior CIRCULATION) COMPARISON:  CT angiogram of the head and neck of March 04, 2022. MEDICATIONS: The antibiotic was administered within 1 hour of the procedure. ANESTHESIA/SEDATION: General anesthesia. CONTRAST:  Omnipaque 300 approximately 150 mL. FLUOROSCOPY TIME:  Fluoroscopy  Time: 112 minutes 54 seconds (2955 mGy). COMPLICATIONS: None immediate. TECHNIQUE: Following a full explanation of the procedure along with the potential associated complications, an informed witnessed cons

## 2022-03-12 ENCOUNTER — Encounter: Payer: Self-pay | Admitting: Nurse Practitioner

## 2022-03-12 DIAGNOSIS — Z515 Encounter for palliative care: Secondary | ICD-10-CM | POA: Diagnosis not present

## 2022-03-12 DIAGNOSIS — E440 Moderate protein-calorie malnutrition: Secondary | ICD-10-CM | POA: Insufficient documentation

## 2022-03-12 DIAGNOSIS — I63512 Cerebral infarction due to unspecified occlusion or stenosis of left middle cerebral artery: Secondary | ICD-10-CM | POA: Diagnosis not present

## 2022-03-12 NOTE — Progress Notes (Addendum)
? ?  Palliative Medicine Inpatient Follow Up Note ? ?HPI: ?88 y.o. female  with past medical history of GERD, HTN, HLD, vit d deficiency admitted on 02/27/2022 with stroke- received TNK and had thrombectomy with stent placement. Extubated on 4/15. She remained lethargic after extubation. On 4/17 went into respiratory failure- probably aspiration pneumonia and required reintubation. She is having A fib with RVR. Palliative consulted for [place].  ? ?Today's Discussion (03/12/2022): ? ?*Please note that this is a verbal dictation therefore any spelling or grammatical errors are due to the "[employer]" system interpretation. ? ?Chart reviewed inclusive of vital signs, progress notes, laboratory results, and diagnostic images.  ? ?I met with patients two daughters and granddaughter at bedside this morning.  ? ?Patient was more labored therefore a bolus was given. Discussed the natural death and dying process and how a time-line can sometimes be unpredictable.  ? ?Created space and opportunity for patient to explore thoughts feelings and fears regarding current medical situation. Family worried they made the decision towards comfort too hastily. I shared that their decision was more than appropriate. ? ?Lamented on what a boisterous spirit Peggye was.  ? ?Questions and concerns addressed  ? ?Palliative Support Provided ?___________________________________ ?Addendum: ? ?I checked on Jahyra this afternoon. She appears comfortable and non-distressed. Breaths appear to be getting more shallow. Patients spouse updated at bedside. ? ?Patients two grandchildren provided with excuse letters for their college professors. ? ?Objective Assessment: ?Vital Signs ?Vitals:  ? 03/11/22 0902 03/12/22 0617  ?BP: (!) 99/54 (!) 162/131  ?Pulse: (!) 157 (!) 160  ?Resp: 18 17  ?Temp: 99.2 ?F (37.3 ?C) 99.7 ?F (37.6 ?C)  ?SpO2: (!) 76% (!) 71%  ? ? ?Intake/Output Summary (Last 24 hours) at 03/12/2022 1109 ?Last data filed at 03/12/2022  0830 ?Gross per 24 hour  ?Intake 0 ml  ?Output --  ?Net 0 ml  ? ?Last Weight  Most recent update: 03/09/2022  5:43 AM  ? ? Weight  ?54 kg (119 lb 0.8 oz)  ?      ? ?  ? ?Gen:  Frail elderly geriatric patient ?HEENT: Dry mucous membranes ?CV: Regular rate and rhythm ?PULM: 2LPM [place] ?ABD: soft/nontender ?EXT: No edema  ?Neuro: Somnolent ? ?SUMMARY OF RECOMMENDATIONS   ?DNAR/DNI ? ?Comfort focused Care ? ?On Morphine gtt - Spoke to RN, Shirlean Mylar about symptoms checks and boluses ? ?Unrestricted visitation ? ?Ongoing Palliative Support ? ?MDM - High ?______________________________________________________________________________________ ?Tacey Ruiz ?[facility] Team ?Team Cell Phone: [phone] ?Please utilize secure chat with additional questions, if there is no response within 30 minutes please call the above phone number ? ?Palliative Medicine Team providers are available by phone from 7am to 7pm daily and can be reached through the team cell phone.  ?Should this patient require assistance outside of these hours, please call the patient's attending physician. ? ? ? ? ?

## 2022-03-12 NOTE — Progress Notes (Addendum)
STROKE TEAM PROGRESS NOTE  ? ?SUBJECTIVE (INTERVAL HISTORY) ?Her daughter is at the bedside. Pt lying in bed, unconscious, no acute distress. Comfort care continues. Morphine gtt increased for comfort. Continue support. ? ?OBJECTIVE ?Temp:  [99.7 ?F (37.6 ?C)] 99.7 ?F (37.6 ?C) (04/22 0617) ?Pulse Rate:  [160] 160 (04/22 0617) ?Resp:  [17] 17 (04/22 0617) ?BP: (162)/(131) 162/131 (04/22 0617) ?SpO2:  [71 %] 71 % (04/22 0617) ? ?Recent Labs  ?Lab 03/08/22 ?1932 03/08/22 ?2348 03/09/22 ?0330 03/09/22 ?[id number] 03/09/22 ?1107  ?GLUCAP 128* 136* 130* 132* 132*  ? ? ?Recent Labs  ?Lab 03/06/22 ?1003 03/06/22 ?1527 03/07/22 ?0856 03/07/22 ?0919 03/07/22 ?1609 03/08/22 ?0612 03/09/22 ?0400  ?NA 140  --  139 140  --  139 140  ?K 3.2*  --  3.9 4.0  --  3.4* 3.9  ?CL 112*  --   --  110  --  108 107  ?CO2 20*  --   --  24  --  26 27  ?GLUCOSE 99  --   --  175*  --  179* 130*  ?BUN 10  --   --  13  --  15 16  ?CREATININE 0.89  --   --  0.69  --  0.67 0.63  ?CALCIUM 8.6*  --   --  8.8*  --  8.5* 8.7*  ?MG  --  1.6*  --  1.7 1.7 1.7  --   ?PHOS  --  2.7  --  2.3* 2.6 2.6  --   ? ? ?No results for input(s): AST, ALT, ALKPHOS, BILITOT, PROT, ALBUMIN in the last 168 hours. ? ?Recent Labs  ?Lab 03/06/22 ?1003 03/07/22 ?0856 03/07/22 ?0919 03/08/22 ?0612 03/09/22 ?0400  ?WBC 12.9*  --  12.1* 10.0 11.1*  ?HGB 8.9* 8.8* 9.1* 8.4* 9.0*  ?HCT 26.2* 26.0* 27.0* 25.6* 27.9*  ?MCV 101.6*  --  101.9* 102.4* 104.1*  ?PLT 218  --  197 186 207  ? ? ?No results for input(s): CKTOTAL, CKMB, CKMBINDEX, TROPONINI in the last 168 hours. ?No results for input(s): LABPROT, INR in the last 72 hours. ? ?No results for input(s): COLORURINE, LABSPEC, PHURINE, GLUCOSEU, HGBUR, BILIRUBINUR, KETONESUR, PROTEINUR, UROBILINOGEN, NITRITE, LEUKOCYTESUR in the last 72 hours. ? ?Invalid input(s): APPERANCEUR  ?   ?Component Value Date/Time  ? CHOL 125 03/05/2022 0503  ? TRIG 46 03/08/2022 0612  ? HDL 45 03/05/2022 0503  ? CHOLHDL 2.8 03/05/2022 0503  ? VLDL 20  03/05/2022 0503  ? LDLCALC 60 03/05/2022 0503  ? ?Lab Results  ?Component Value Date  ? HGBA1C 5.7 (H) 03/20/2022  ? ?   ?Component Value Date/Time  ? LABOPIA NONE DETECTED 03/05/2022 1017  ? COCAINSCRNUR NONE DETECTED 03/05/2022 1017  ? LABBENZ NONE DETECTED 03/05/2022 1017  ? AMPHETMU NONE DETECTED 03/05/2022 1017  ? THCU NONE DETECTED 03/05/2022 1017  ? LABBARB NONE DETECTED 03/05/2022 1017  ?  ?No results for input(s): ETH in the last 168 hours. ? ? ?I have personally reviewed the radiological images below and agree with the radiology interpretations. ? ?DG Abd 1 View ? ?Result Date: 03/06/2022 ?CLINICAL DATA:  Nasogastric tube placement. EXAM: ABDOMEN - 1 VIEW COMPARISON:  03/05/2022 FINDINGS: Nasogastric tube remains in place with tip overlying the gastric antrum. No evidence of dilated bowel loops. IMPRESSION: Nasogastric tube tip overlies the gastric antrum. Electronically Signed   By: Danae OrleansJohn A Stahl M.D.   On: 03/06/2022 09:10  ? ?DG Abd 1 View ? ?Result Date: 03/05/2022 ?CLINICAL DATA:  OG tube placement EXAM: ABDOMEN - 1 VIEW COMPARISON:  None. FINDINGS: Esophageal tube tip overlies the gastric body. Atelectasis or scar at the left base. Visible gas pattern is unobstructed IMPRESSION: Esophageal tube tip overlies the body of the stomach Electronically Signed   By: Jasmine Pang M.D.   On: 03/05/2022 19:53  ? ?MR ANGIO HEAD WO CONTRAST ? ?Result Date: 03/05/2022 ?CLINICAL DATA:  Stroke, follow-up. Status post revascularization of left M1 occlusion. EXAM: MRA HEAD WITHOUT CONTRAST TECHNIQUE: Angiographic images of the Circle of Willis were acquired using MRA technique without intravenous contrast. FINDINGS: Anterior circulation: The right internal carotid artery, A1 segment and M1 segments are normal. Right ACA and MCA branch vessels are normal. The left M1 segment remains patent with signal to the stent. Artifact noted at the stent. Anterior branch vessels are not seen. Posterior circulation: The left vertebral  artery is dominant. Basilar artery is within normal limits. Posterior cerebral arteries originate from the basilar tip. PCA branch vessels are within normal limits. Anatomic variants: None Other: None. IMPRESSION: 1. Patent left M1 segment with signal to the stent. 2. Posterior branches distal to the stent less prominent than on the previous study. 3. Persistent occlusion of anterior branches. 4. Right MCA, bilateral ACA, and posterior circulation within normal limits. Electronically Signed   By: Marin Roberts M.D.   On: 03/05/2022 15:04  ? ?MR BRAIN WO CONTRAST ? ?Result Date: 03/05/2022 ?CLINICAL DATA:  Neuro deficit, acute, stroke suspected. Sudden onset right-sided weakness, left gaze deviation and right hemianopsia. Patient was found unresponsive. EXAM: MRI HEAD WITHOUT CONTRAST TECHNIQUE: Multiplanar, multiecho pulse sequences of the brain and surrounding structures were obtained without intravenous contrast. COMPARISON:  CT head without contrast 03-20-22 and CTA head and neck 2022/03/20 FINDINGS: Brain: Restricted diffusion is present with acute nonhemorrhagic infarct involving the left lentiform nucleus, the anterior insular cortex, left corona radiata and scattered areas in the high left frontal and parietal cortex. A single cortical infarct in the anterior right frontal lobe measures 6 mm on image 39 of series 2. A separate posterior MCA territory infarct involves the posterior left parietal and temporal lobe, potentially near the watershed. T2 hyperintensities are associated with these areas of acute/subacute infarction. Diffuse periventricular and subcortical white matter changes are also present bilaterally. White matter changes extend into the brainstem. Remote infarcts are present posterior cerebellum bilaterally without hemorrhage. The ventricles are of normal size. No significant extraaxial fluid collection is present. The internal auditory canals are within normal limits. Vascular: Flow is  present in the major intracranial arteries. Skull and upper cervical spine: The craniocervical junction is normal. Upper cervical spine is within normal limits. Marrow signal is unremarkable. Sinuses/Orbits: Right sphenoid sinus is opacified. The paranasal sinuses and mastoid air cells are otherwise clear. Bilateral lens replacements are noted. Globes and orbits are otherwise unremarkable. IMPRESSION: 1. Acute/subacute nonhemorrhagic infarct involving the left lentiform nucleus, anterior insular cortex, left corona radiata, and scattered areas in the high left frontal and parietal cortex. 2. Additional posterior left MCA territory infarct, potentially near the watershed involving the posterior left parietal lobe and temporal lobe. 3. Single 6 mm cortical infarct in the anterior right frontal lobe. 4. Remote infarcts of the posterior cerebellum bilaterally. 5. Diffuse periventricular and subcortical white matter disease bilaterally likely reflects the sequela of chronic microvascular ischemia. Electronically Signed   By: Marin Roberts M.D.   On: 03/05/2022 14:58  ? ?IR CT Head Ltd ? ?Result Date: 03/08/2022 ?INDICATION: New onset global aphasia  and right-sided hemiplegia and left gaze deviation. Occluded left middle cerebral artery M1 segment on CT angiogram of the head and neck. EXAM: 1. EMERGENT LARGE VESSEL OCCLUSION THROMBOLYSIS (anterior CIRCULATION) COMPARISON:  CT angiogram of the head and neck of March 04, 2022. MEDICATIONS: The antibiotic was administered within 1 hour of the procedure. ANESTHESIA/SEDATION: General anesthesia. CONTRAST:  Omnipaque 300 approximately 150 mL. FLUOROSCOPY TIME:  Fluoroscopy Time: 112 minutes 54 seconds (2955 mGy). COMPLICATIONS: None immediate. TECHNIQUE: Following a full explanation of the procedure along with the potential associated complications, an informed witnessed consent was obtained. The risks of intracranial hemorrhage of 10%, worsening neurological deficit,  ventilator dependency, death and inability to revascularize were all reviewed in detail with the patient's daughter. The patient was then put under general anesthesia by the Department of Anesthesiology at [facility]

## 2022-03-13 ENCOUNTER — Other Ambulatory Visit: Payer: Self-pay

## 2022-03-13 ENCOUNTER — Encounter (HOSPITAL_COMMUNITY): Payer: Self-pay | Admitting: Neurology

## 2022-03-13 DIAGNOSIS — Z515 Encounter for palliative care: Secondary | ICD-10-CM | POA: Diagnosis not present

## 2022-03-13 DIAGNOSIS — Z7189 Other specified counseling: Secondary | ICD-10-CM | POA: Diagnosis not present

## 2022-03-13 DIAGNOSIS — I63512 Cerebral infarction due to unspecified occlusion or stenosis of left middle cerebral artery: Secondary | ICD-10-CM | POA: Diagnosis not present

## 2022-03-21 NOTE — Discharge Summary (Addendum)
Patient ID: ?Arrie Eastern ?MRN: [id number] ?DOB/AGE: 1933/02/17 88 y.o. ? ?Admit date: 03/09/22 ?Death date: March 18, 2022 @ 1720 ? ?Admission Diagnoses:Cerebral infarction due to embolism of  left middle cerebral artery ? ?Cause of Death: Left middle cerebral artery infarction status post IV thrombolysis with TNK and mechanical thrombectomy requiring rescue angioplasty and stenting likely due to atrial fibrillation ? ?Pertinent Medical Diagnosis: ?Principal Problem: ?  Acute ischemic left MCA stroke ([place]) ?Active Problems: ?  Middle cerebral artery embolism, left ?  Ventilator dependence ([place]) ?  Malnutrition of moderate degree ?IV thrombolysis with TNK ?Mechanical thrombectomy of left MCA ?Acute hypoxic respiratory failure ?Probable aspiration pneumonia ?Paroxysmal A-fib with rapid ventricular response ?Hypertension ?Hyperlipidemia ?Hypokalemia ? ? ?Hospital Course: Elizabeth Brock is a 88 y.o. female with PMH significant for GERD, hypertension, hyperlipidemia, osteoporosis, vitamin D deficiency who presented with sudden onset right-sided weakness, left gaze deviation, right hemianopsia.  She was given IV TNK after discussing the risk benefit carefully with her daughter and then taken for emergent mechanical thrombectomy which was performed with TICI2b revascularization.  She required rescue left M1 angioplasty stenting.  MRI scan showed moderate size left MCA scattered infarcts along with a small right MCA/ACA infarct.  Patient was initially intubated but had trouble protecting her airway requiring reintubation.  Neurological exam suggested aphasia and right hemiparesis.  It is obvious that patient will need prolonged ventilatory support and possible tracheostomy and after live in a nursing home requiring 24-hour care.  Patient's family ?Wishes and that she would not want this since they decided to make her DNR and comfort care.  She was extubated and kept comfortable on morphine drip and passed away  peacefully. ? ?Signed: ?Antony Contras ?03/16/2022, 2:15 PM  ?

## 2022-03-21 NOTE — Plan of Care (Signed)
  Problem: Pain Managment: Goal: General experience of comfort will improve Outcome: Progressing   

## 2022-03-21 NOTE — Progress Notes (Signed)
Charge nurse ben and nurse Tesneem Dufrane confirmed pt without audible heart sounds palpable pulse rr breath sounds or chest rise. [facility] Palliative nurse informed. Pt family at bedside, emotional support provided. TOD 1720. ?

## 2022-03-21 NOTE — Progress Notes (Signed)
50 ml  morphine 100mg /100cc concentration wasted with charge nurse carla ?

## 2022-03-21 NOTE — Progress Notes (Addendum)
STROKE TEAM PROGRESS NOTE  ? ?SUBJECTIVE (INTERVAL HISTORY) ?Her daughter is at the bedside. Pt lying in bed, unconscious, no acute distress. Comfort care continues. Morphine gtt increased for comfort. Continue support. ? ?OBJECTIVE ?Resp:  [24] 24 (04/22 1320) ? ?Recent Labs  ?Lab 03/08/22 ?1932 03/08/22 ?2348 03/09/22 ?0330 03/09/22 ?[id number] 03/09/22 ?1107  ?GLUCAP 128* 136* 130* 132* 132*  ? ? ?Recent Labs  ?Lab 03/06/22 ?1527 03/07/22 ?0856 03/07/22 ?0919 03/07/22 ?1609 03/08/22 ?0612 03/09/22 ?0400  ?NA  --  139 140  --  139 140  ?K  --  3.9 4.0  --  3.4* 3.9  ?CL  --   --  110  --  108 107  ?CO2  --   --  24  --  26 27  ?GLUCOSE  --   --  175*  --  179* 130*  ?BUN  --   --  13  --  15 16  ?CREATININE  --   --  0.69  --  0.67 0.63  ?CALCIUM  --   --  8.8*  --  8.5* 8.7*  ?MG 1.6*  --  1.7 1.7 1.7  --   ?PHOS 2.7  --  2.3* 2.6 2.6  --   ? ? ?No results for input(s): AST, ALT, ALKPHOS, BILITOT, PROT, ALBUMIN in the last 168 hours. ? ?Recent Labs  ?Lab 03/07/22 ?0856 03/07/22 ?0919 03/08/22 ?0612 03/09/22 ?0400  ?WBC  --  12.1* 10.0 11.1*  ?HGB 8.8* 9.1* 8.4* 9.0*  ?HCT 26.0* 27.0* 25.6* 27.9*  ?MCV  --  101.9* 102.4* 104.1*  ?PLT  --  197 186 207  ? ? ?No results for input(s): CKTOTAL, CKMB, CKMBINDEX, TROPONINI in the last 168 hours. ?No results for input(s): LABPROT, INR in the last 72 hours. ? ?No results for input(s): COLORURINE, LABSPEC, PHURINE, GLUCOSEU, HGBUR, BILIRUBINUR, KETONESUR, PROTEINUR, UROBILINOGEN, NITRITE, LEUKOCYTESUR in the last 72 hours. ? ?Invalid input(s): APPERANCEUR  ?   ?Component Value Date/Time  ? CHOL 125 03/05/2022 0503  ? TRIG 46 03/08/2022 0612  ? HDL 45 03/05/2022 0503  ? CHOLHDL 2.8 03/05/2022 0503  ? VLDL 20 03/05/2022 0503  ? LDLCALC 60 03/05/2022 0503  ? ?Lab Results  ?Component Value Date  ? HGBA1C 5.7 (H) 03-21-2022  ? ?   ?Component Value Date/Time  ? LABOPIA NONE DETECTED 03/05/2022 1017  ? COCAINSCRNUR NONE DETECTED 03/05/2022 1017  ? LABBENZ NONE DETECTED 03/05/2022 1017   ? AMPHETMU NONE DETECTED 03/05/2022 1017  ? THCU NONE DETECTED 03/05/2022 1017  ? LABBARB NONE DETECTED 03/05/2022 1017  ?  ?No results for input(s): ETH in the last 168 hours. ? ? ?I have personally reviewed the radiological images below and agree with the radiology interpretations. ? ?DG Abd 1 View ? ?Result Date: 03/06/2022 ?CLINICAL DATA:  Nasogastric tube placement. EXAM: ABDOMEN - 1 VIEW COMPARISON:  03/05/2022 FINDINGS: Nasogastric tube remains in place with tip overlying the gastric antrum. No evidence of dilated bowel loops. IMPRESSION: Nasogastric tube tip overlies the gastric antrum. Electronically Signed   By: Danae OrleansJohn A Stahl M.D.   On: 03/06/2022 09:10  ? ?DG Abd 1 View ? ?Result Date: 03/05/2022 ?CLINICAL DATA:  OG tube placement EXAM: ABDOMEN - 1 VIEW COMPARISON:  None. FINDINGS: Esophageal tube tip overlies the gastric body. Atelectasis or scar at the left base. Visible gas pattern is unobstructed IMPRESSION: Esophageal tube tip overlies the body of the stomach Electronically Signed   By: Jasmine PangKim  Fujinaga M.D.   On: 03/05/2022  19:53  ? ?MR ANGIO HEAD WO CONTRAST ? ?Result Date: 03/05/2022 ?CLINICAL DATA:  Stroke, follow-up. Status post revascularization of left M1 occlusion. EXAM: MRA HEAD WITHOUT CONTRAST TECHNIQUE: Angiographic images of the Circle of Willis were acquired using MRA technique without intravenous contrast. FINDINGS: Anterior circulation: The right internal carotid artery, A1 segment and M1 segments are normal. Right ACA and MCA branch vessels are normal. The left M1 segment remains patent with signal to the stent. Artifact noted at the stent. Anterior branch vessels are not seen. Posterior circulation: The left vertebral artery is dominant. Basilar artery is within normal limits. Posterior cerebral arteries originate from the basilar tip. PCA branch vessels are within normal limits. Anatomic variants: None Other: None. IMPRESSION: 1. Patent left M1 segment with signal to the stent. 2.  Posterior branches distal to the stent less prominent than on the previous study. 3. Persistent occlusion of anterior branches. 4. Right MCA, bilateral ACA, and posterior circulation within normal limits. Electronically Signed   By: San Morelle M.D.   On: 03/05/2022 15:04  ? ?MR BRAIN WO CONTRAST ? ?Result Date: 03/05/2022 ?CLINICAL DATA:  Neuro deficit, acute, stroke suspected. Sudden onset right-sided weakness, left gaze deviation and right hemianopsia. Patient was found unresponsive. EXAM: MRI HEAD WITHOUT CONTRAST TECHNIQUE: Multiplanar, multiecho pulse sequences of the brain and surrounding structures were obtained without intravenous contrast. COMPARISON:  CT head without contrast 02/24/2022 and CTA head and neck 03/17/2022 FINDINGS: Brain: Restricted diffusion is present with acute nonhemorrhagic infarct involving the left lentiform nucleus, the anterior insular cortex, left corona radiata and scattered areas in the high left frontal and parietal cortex. A single cortical infarct in the anterior right frontal lobe measures 6 mm on image 39 of series 2. A separate posterior MCA territory infarct involves the posterior left parietal and temporal lobe, potentially near the watershed. T2 hyperintensities are associated with these areas of acute/subacute infarction. Diffuse periventricular and subcortical white matter changes are also present bilaterally. White matter changes extend into the brainstem. Remote infarcts are present posterior cerebellum bilaterally without hemorrhage. The ventricles are of normal size. No significant extraaxial fluid collection is present. The internal auditory canals are within normal limits. Vascular: Flow is present in the major intracranial arteries. Skull and upper cervical spine: The craniocervical junction is normal. Upper cervical spine is within normal limits. Marrow signal is unremarkable. Sinuses/Orbits: Right sphenoid sinus is opacified. The paranasal sinuses and  mastoid air cells are otherwise clear. Bilateral lens replacements are noted. Globes and orbits are otherwise unremarkable. IMPRESSION: 1. Acute/subacute nonhemorrhagic infarct involving the left lentiform nucleus, anterior insular cortex, left corona radiata, and scattered areas in the high left frontal and parietal cortex. 2. Additional posterior left MCA territory infarct, potentially near the watershed involving the posterior left parietal lobe and temporal lobe. 3. Single 6 mm cortical infarct in the anterior right frontal lobe. 4. Remote infarcts of the posterior cerebellum bilaterally. 5. Diffuse periventricular and subcortical white matter disease bilaterally likely reflects the sequela of chronic microvascular ischemia. Electronically Signed   By: San Morelle M.D.   On: 03/05/2022 14:58  ? ?[place] ? ?Result Date: 03/08/2022 ?INDICATION: New onset global aphasia and right-sided hemiplegia and left gaze deviation. Occluded left middle cerebral artery M1 segment on CT angiogram of the head and neck. EXAM: 1. EMERGENT LARGE VESSEL OCCLUSION THROMBOLYSIS (anterior CIRCULATION) COMPARISON:  CT angiogram of the head and neck of March 04, 2022. MEDICATIONS: The antibiotic was administered within 1 hour of the  procedure. ANESTHESIA/SEDATION: General anesthesia. CONTRAST:  Omnipaque 300 approximately 150 mL. FLUOROSCOPY TIME:  Fluoroscopy Time: 112 minutes 54 seconds (2955 mGy). COMPLICATIONS: None immediate. TECHNIQUE: Following a full explanation of the procedure along with the potential associated complications, an informed witnessed consent was obtained. The risks of intracranial hemorrhage of 10%, worsening neurological deficit, ventilator dependency, death and inability to revascularize were all reviewed in detail with the patient's daughter. The patient was then put under general anesthesia by the Department of Anesthesiology at [facility]. The right groin was prepped and draped in the  usual sterile fashion. Thereafter using modified Seldinger technique, transfemoral access into the right common femoral artery was obtained without difficulty. Over a 0.035 inch guidewire an 8 [place] 15 cm

## 2022-03-21 NOTE — Progress Notes (Addendum)
? ?  Palliative Medicine Inpatient Follow Up Note ? ?HPI: ?88 y.o. female  with past medical history of GERD, HTN, HLD, vit d deficiency admitted on 02/19/2022 with stroke- received TNK and had thrombectomy with stent placement. Extubated on 4/15. She remained lethargic after extubation. On 4/17 went into respiratory failure- probably aspiration pneumonia and required reintubation. She is having A fib with RVR. Palliative consulted for [place].  ? ?Today's Discussion (Mar 22, 2022): ? ?*Please note that this is a verbal dictation therefore any spelling or grammatical errors are due to the "[employer]" system interpretation. ? ?Chart reviewed inclusive of vital signs, progress notes, laboratory results, and diagnostic images.  ? ?I met with patients two daughters at bedside this morning.  ? ?Patients daughters express that Teyanna has been comfortable throughout the night. They request that she get cleaned up at the very least. I shared that I would speak to the nursing staff to ensure that she gets cleansed appropriately. ? ?Marbeth has one additional family member who will be visiting today for the [place] - we reviewed the thought that she could be waiting for them to declare her final moments. ? ?Again, discussed the unpredictability of the death and dying process. I shared with family that I suspect she is edging closer today than yesterday to mortality but we have to allow her body time. ? ?I spoke to patients RN, Shirlean Mylar about helping to bath the patient. We reviewed again, a symptom management plan.  ? ?Questions and concerns addressed  ? ?Palliative Support Provided ?___________________________________ ?Addendum: ? ?Family requested that I stop by this evening as patient appearing more distress. Morphine bolus given and gtt rate increased. Notable rattling now and cheyne-stokes respirations. I shared that I anticipate she will pass shortly. Education provided and repositioning pursued. Will add glycopyrrolate  ATC ? ?Objective Assessment: ?Vital Signs ?Vitals:  ? 03/12/22 0900 03/12/22 1320  ?BP:    ?Pulse:    ?Resp: (!) 24 (!) 24  ?Temp:    ?SpO2:    ? ? ?Intake/Output Summary (Last 24 hours) at Mar 22, 2022 1000 ?Last data filed at 03/22/2022 2585 ?Gross per 24 hour  ?Intake 313.4 ml  ?Output --  ?Net 313.4 ml  ? ? ?Last Weight  Most recent update: 03/09/2022  5:43 AM  ? ? Weight  ?54 kg (119 lb 0.8 oz)  ?      ? ?  ? ?Gen:  Frail elderly geriatric patient ?HEENT: Dry mucous membranes ?CV: Irregular rate and rhythm ?PULM: RA, breathing is even and nonlabored ?ABD: soft/nontender ?EXT: No edema  ?Neuro: Somnolent ? ?SUMMARY OF RECOMMENDATIONS   ?DNAR/DNI ? ?Comfort focused Care ? ?On Morphine gtt at $Remo'8mg'aREWt$ /hr --> Nursing aware of possible bolus needs and gtt rate increases ? ?Unrestricted visitation ? ?Ongoing Palliative Support ? ?MDM - High ?______________________________________________________________________________________ ?Tacey Ruiz ?[place] Team ?Team Cell Phone: [phone] ?Please utilize secure chat with additional questions, if there is no response within 30 minutes please call the above phone number ? ?Palliative Medicine Team providers are available by phone from 7am to 7pm daily and can be reached through the team cell phone.  ?Should this patient require assistance outside of these hours, please call the patient's attending physician. ? ? ? ? ?

## 2022-03-21 DEATH — deceased

## 2022-06-04 IMAGING — DX DG CHEST 1V PORT
1 series · 1 of 1 positions shown · non-contrast
Comparison: AP chest 03/04/2022

CLINICAL DATA: Encounter for intubation.

EXAM:
PORTABLE CHEST 1 VIEW

[chest]
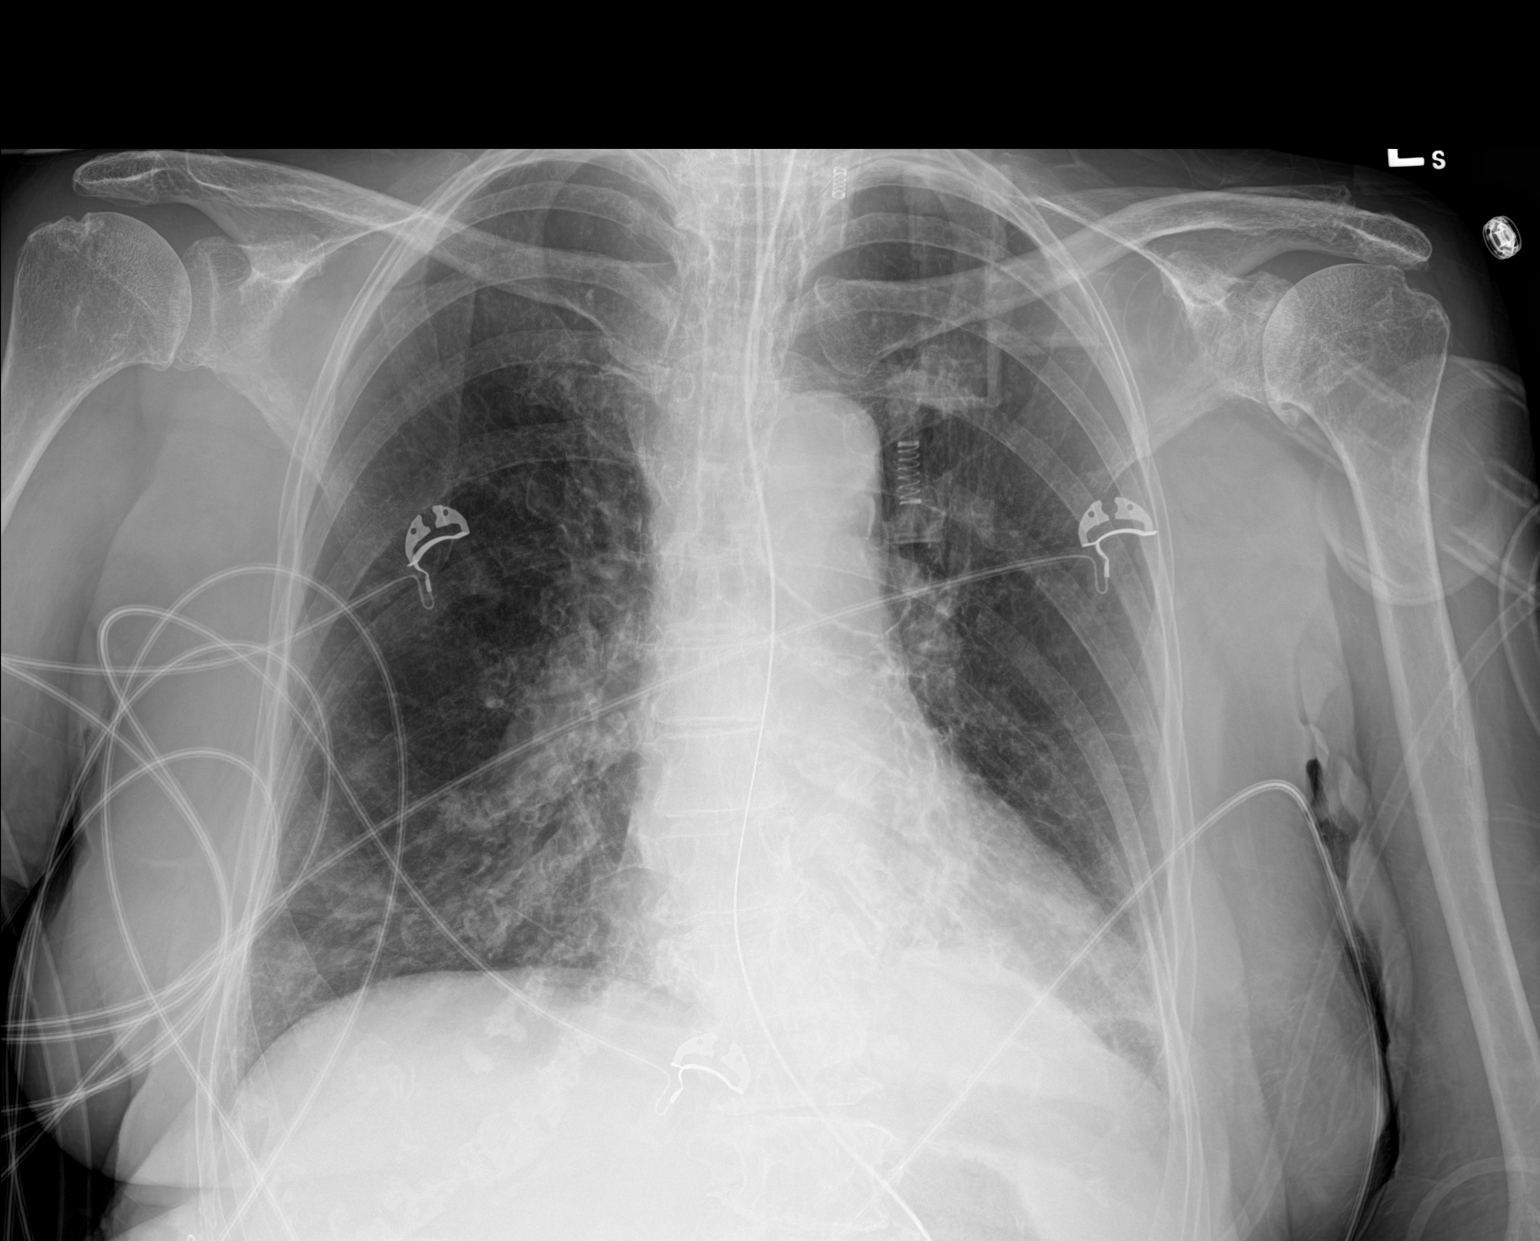

[1 of 1 positions shown; findings below may reference images not displayed]

FINDINGS: Endotracheal tube tip terminates approximately 5.2 cm above the
carina just inferior to the clavicular heads. Enteric tube descends
below the diaphragm with the tip excluded by collimation.

Cardiac silhouette and mediastinal contours are within normal
limits. Mild calcification within aortic arch. Chronic appearing
bilateral lower lung interstitial thickening is unchanged. No
pleural effusion or pneumothorax. Severe left and mild-to-moderate
right glenohumeral osteoarthritis.
IMPRESSION: Endotracheal tube in appropriate position.

No acute lung process.
